# Patient Record
Sex: Male | Born: 1945 | Race: Black or African American | Hispanic: No | Marital: Married | Smoking: Former smoker
Health system: Southern US, Community
[De-identification: ages and names within clinical notes are randomized; demographics above are authoritative.]

## PROBLEM LIST (undated history)

## (undated) DIAGNOSIS — I700 Atherosclerosis of aorta: Secondary | ICD-10-CM

## (undated) DIAGNOSIS — E119 Type 2 diabetes mellitus without complications: Secondary | ICD-10-CM

## (undated) DIAGNOSIS — D126 Benign neoplasm of colon, unspecified: Secondary | ICD-10-CM

## (undated) DIAGNOSIS — Z8679 Personal history of other diseases of the circulatory system: Secondary | ICD-10-CM

## (undated) DIAGNOSIS — J449 Chronic obstructive pulmonary disease, unspecified: Secondary | ICD-10-CM

## (undated) DIAGNOSIS — B009 Herpesviral infection, unspecified: Secondary | ICD-10-CM

## (undated) DIAGNOSIS — I10 Essential (primary) hypertension: Secondary | ICD-10-CM

---

## 2006-01-21 ENCOUNTER — Ambulatory Visit: Payer: Self-pay | Admitting: Gastroenterology

## 2009-04-09 ENCOUNTER — Ambulatory Visit: Payer: Self-pay | Admitting: Family Medicine

## 2009-05-09 ENCOUNTER — Ambulatory Visit: Payer: Self-pay | Admitting: Gastroenterology

## 2012-07-08 ENCOUNTER — Ambulatory Visit: Payer: Self-pay | Admitting: Gastroenterology

## 2012-07-09 LAB — PATHOLOGY REPORT

## 2014-09-18 ENCOUNTER — Emergency Department: Payer: Medicare Other

## 2014-09-18 ENCOUNTER — Encounter: Payer: Self-pay | Admitting: Emergency Medicine

## 2014-09-18 ENCOUNTER — Emergency Department
Admission: EM | Admit: 2014-09-18 | Discharge: 2014-09-18 | Disposition: A | Discharge status: Home / Self Care | Payer: Medicare Other | Attending: Emergency Medicine | Admitting: Emergency Medicine

## 2014-09-18 DIAGNOSIS — S50812A Abrasion of left forearm, initial encounter: Secondary | ICD-10-CM | POA: Insufficient documentation

## 2014-09-18 DIAGNOSIS — Z23 Encounter for immunization: Secondary | ICD-10-CM | POA: Diagnosis not present

## 2014-09-18 DIAGNOSIS — S9302XA Subluxation of left ankle joint, initial encounter: Secondary | ICD-10-CM | POA: Diagnosis not present

## 2014-09-18 DIAGNOSIS — S92102A Unspecified fracture of left talus, initial encounter for closed fracture: Secondary | ICD-10-CM | POA: Diagnosis not present

## 2014-09-18 DIAGNOSIS — Z87891 Personal history of nicotine dependence: Secondary | ICD-10-CM | POA: Diagnosis not present

## 2014-09-18 DIAGNOSIS — Y998 Other external cause status: Secondary | ICD-10-CM | POA: Insufficient documentation

## 2014-09-18 DIAGNOSIS — W11XXXA Fall on and from ladder, initial encounter: Secondary | ICD-10-CM | POA: Insufficient documentation

## 2014-09-18 DIAGNOSIS — T148XXA Other injury of unspecified body region, initial encounter: Secondary | ICD-10-CM

## 2014-09-18 DIAGNOSIS — Y9389 Activity, other specified: Secondary | ICD-10-CM | POA: Diagnosis not present

## 2014-09-18 DIAGNOSIS — I10 Essential (primary) hypertension: Secondary | ICD-10-CM | POA: Diagnosis not present

## 2014-09-18 DIAGNOSIS — R61 Generalized hyperhidrosis: Secondary | ICD-10-CM | POA: Insufficient documentation

## 2014-09-18 DIAGNOSIS — E119 Type 2 diabetes mellitus without complications: Secondary | ICD-10-CM | POA: Diagnosis not present

## 2014-09-18 DIAGNOSIS — S99912A Unspecified injury of left ankle, initial encounter: Secondary | ICD-10-CM | POA: Diagnosis present

## 2014-09-18 DIAGNOSIS — W19XXXA Unspecified fall, initial encounter: Secondary | ICD-10-CM

## 2014-09-18 DIAGNOSIS — Y9289 Other specified places as the place of occurrence of the external cause: Secondary | ICD-10-CM | POA: Diagnosis not present

## 2014-09-18 DIAGNOSIS — S93315A Dislocation of tarsal joint of left foot, initial encounter: Secondary | ICD-10-CM

## 2014-09-18 HISTORY — DX: Type 2 diabetes mellitus without complications: E11.9

## 2014-09-18 HISTORY — DX: Essential (primary) hypertension: I10

## 2014-09-18 LAB — BASIC METABOLIC PANEL
Anion gap: 11 (ref 5–15)
BUN: 15 mg/dL (ref 6–20)
CO2: 26 mmol/L (ref 22–32)
Calcium: 8.7 mg/dL — ABNORMAL LOW (ref 8.9–10.3)
Chloride: 96 mmol/L — ABNORMAL LOW (ref 101–111)
Creatinine, Ser: 0.99 mg/dL (ref 0.61–1.24)
GFR calc Af Amer: >60 mL/min (ref >60)
GFR calc non Af Amer: >60 mL/min (ref >60)
Glucose, Bld: 151 mg/dL — ABNORMAL HIGH (ref 65–99)
Potassium: 3.4 mmol/L — ABNORMAL LOW (ref 3.5–5.1)
SODIUM: 133 mmol/L — AB (ref 135–145)

## 2014-09-18 LAB — CBC
HCT: 41 % (ref 40.0–52.0)
HEMOGLOBIN: 13.6 g/dL (ref 13.0–18.0)
MCH: 28.6 pg (ref 26.0–34.0)
MCHC: 33.3 g/dL (ref 32.0–36.0)
MCV: 85.9 fL (ref 80.0–100.0)
PLATELETS: 226 10*3/uL (ref 150–440)
RBC: 4.77 MIL/uL (ref 4.40–5.90)
RDW: 14.4 % (ref 11.5–14.5)
WBC: 10.6 10*3/uL (ref 3.8–10.6)

## 2014-09-18 LAB — PROTIME-INR
INR: 0.95
PROTHROMBIN TIME: 12.9 s (ref 11.4–15.0)

## 2014-09-18 MED ORDER — FENTANYL CITRATE (PF) 100 MCG/2ML IJ SOLN
INTRAMUSCULAR | Status: AC | PRN
  Administered 2014-09-18: 50 ug via INTRAVENOUS

## 2014-09-18 MED ORDER — HYDROMORPHONE HCL 1 MG/ML IJ SOLN
0.5000 mg | Freq: Once | INTRAMUSCULAR | Status: AC
  Administered 2014-09-18: 0.5 mg via INTRAVENOUS

## 2014-09-18 MED ORDER — OXYCODONE-ACETAMINOPHEN 5-325 MG PO TABS: 1.0000 | ORAL_TABLET | Freq: Four times a day (QID) | ORAL | Status: DC | PRN

## 2014-09-18 MED ORDER — ONDANSETRON 4 MG PO TBDP: 4.0000 mg | ORAL_TABLET | Freq: Four times a day (QID) | ORAL | Status: DC | PRN

## 2014-09-18 MED ORDER — FENTANYL CITRATE (PF) 100 MCG/2ML IJ SOLN
INTRAMUSCULAR | Status: AC
  Filled 2014-09-18: qty 2

## 2014-09-18 MED ORDER — MIDAZOLAM HCL 2 MG/2ML IJ SOLN
INTRAMUSCULAR | Status: AC | PRN
  Administered 2014-09-18 (×2): 2 mg via INTRAVENOUS

## 2014-09-18 MED ORDER — ONDANSETRON HCL 4 MG/2ML IJ SOLN
INTRAMUSCULAR | Status: AC | PRN
  Administered 2014-09-18: 4 mg via INTRAVENOUS

## 2014-09-18 MED ORDER — ONDANSETRON HCL 4 MG/2ML IJ SOLN
INTRAMUSCULAR | Status: AC
  Filled 2014-09-18: qty 2

## 2014-09-18 MED ORDER — TETANUS-DIPHTH-ACELL PERTUSSIS 5-2.5-18.5 LF-MCG/0.5 IM SUSP
INTRAMUSCULAR | Status: AC
  Filled 2014-09-18: qty 0.5

## 2014-09-18 MED ORDER — HYDROMORPHONE HCL 1 MG/ML IJ SOLN: 1.0000 mg | INTRAMUSCULAR | Status: DC

## 2014-09-18 MED ORDER — ONDANSETRON HCL 4 MG/2ML IJ SOLN
4.0000 mg | Freq: Once | INTRAMUSCULAR | Status: AC
  Administered 2014-09-18: 4 mg via INTRAVENOUS

## 2014-09-18 MED ORDER — NALOXONE HCL 1 MG/ML IJ SOLN
INTRAMUSCULAR | Status: DC
Start: 2014-09-18 — End: 2014-09-18
  Filled 2014-09-18: qty 2

## 2014-09-18 MED ORDER — MIDAZOLAM HCL 5 MG/5ML IJ SOLN
INTRAMUSCULAR | Status: AC
  Filled 2014-09-18: qty 5

## 2014-09-18 MED ORDER — HYDROMORPHONE HCL 1 MG/ML IJ SOLN
INTRAMUSCULAR | Status: AC
  Filled 2014-09-18: qty 1

## 2014-09-18 MED ORDER — TETANUS-DIPHTH-ACELL PERTUSSIS 5-2.5-18.5 LF-MCG/0.5 IM SUSP
0.5000 mL | Freq: Once | INTRAMUSCULAR | Status: AC
  Administered 2014-09-18: 0.5 mL via INTRAMUSCULAR

## 2014-09-18 NOTE — Discharge Instructions (Signed)
Ankle Dislocation  Use crutches and do not place any weight on the left foot.  Call Dr. Ammie Ferrier office for follow-up on Monday or Tuesday. Do not drive today. Do not drive while taking Percocet. Please return to the ER right away should he develop a cold or numb foot, weakness in the toes, blue toes, severe pain, or if other concerns arise.  Use Vicodin only as prescribed. Do not drive while taking this medication.  Ankle dislocation is the displacement of the bones that form your ankle joint. The ankle joint is designed for a balance of stability and flexibility. The bones of the ankle are held in place by very strong, fibrous tissues (ligaments) that connect the bones to each other. CAUSES Because the ankle is a very strong and stable joint, ankle dislocation is only caused by a very forceful injury. Typically, injuries that contribute to ankle dislocation include broken bones (fractures) on the inside and outside of the ankle (malleoli).  RELATED COMPLICATIONS Ankle dislocation can lead to more serious complications. Examples of complications associated with ankle dislocation include:  Injury to the strong fibrous tissues that connect muscles to bones (tendons).  Injury to the flexible tissue that cushions the bones in the joint (cartilage). This can lead to the development of arthritis, loss of joint motion, and pain.  Injury to the nerves and blood vessels that cross the ankle. Blood vessel damage may result in bone death of the top bone of the foot (talus).  Skin over the dislocated area being torn (lacerated) or damaged by pressure from the dislocated bones.  Swelling of compartments in the foot (rare). This may damage blood supply to the muscles (compartment syndrome). RISK FACTORS Although dislocation of the ankle can occur in anyone, some people are at greater risk than others. People at increased risk of ankle dislocation include:  Young males. This may be related to their  overall increased risk of injury.  Postmenopausal women. This may be related to their increased risk of bone fracture because of the weakening of the bones that occurs in women in this age group (osteoporosis).  People born with greater looseness (elasticity) in their ligaments. SYMPTOMS Symptoms of ankle dislocation include:  Severe pain.  Swelling.  Deformity around the ankle.  Whitening or laceration of the skin. DIAGNOSIS  A physical exam and an X-ray exam are usually done to help your caregiver diagnose ankle dislocation. TREATMENT Treatment may include:  Manipulation of the ankle by your caregiver to put your ankle back in place (reduction).  Repair of any associated skin lacerations.  Plates and screws used to stabilize the fractures and hold the joint in position after reduction.  Pins drilled into your bones that are connected to bars outside of your skin (external fixator) used to hold your ankle in a fixed position until the swelling in your ankle goes down enough for surgery to be done.  Placement of a cast or splint to allow torn ligaments to heal.  Physical therapy to regain ankle motion and leg strength. HOME CARE INSTRUCTIONS The following measures can help to reduce pain and hasten the healing process:  Rest your injured joint. Do not move it. Avoid activities similar to the one that caused your injury.  Apply ice to your injured joint for 1 to 2 days after your reduction or as directed by your caregiver. Applying ice helps to reduce inflammation and pain.  Put ice in a plastic bag.  Place a towel between your skin and the  bag.  Leave the ice on for 15 to 20 minutes at a time, every couple of hours while you are awake.  Elevate your ankle above your heart to minimize swelling.  Move your toes as instructed by your caregiver to prevent stiffness.  Take over-the-counter or prescription medicines for pain as directed by your caregiver. SEEK IMMEDIATE  MEDICAL CARE IF:  Your cast, splint, screws, plates, or external fixator becomes loose or damaged.  You have an external fixator and you notice fluids draining around the pins.  Your pain becomes worse rather than better.  You lose feeling in your toe or cannot bend the tip of your toe. MAKE SURE YOU:  Understand these instructions.  Will watch your condition.  Will get help right away if you are not doing well or get worse. Document Released: 04/23/2005 Document Revised: 07/16/2011 Document Reviewed: 09/21/2010 [facility] Patient Information 2015 [place], [place]. This information is not intended to replace advice given to you by your health care provider. Make sure you discuss any questions you have with your health care provider.

## 2014-09-18 NOTE — ED Provider Notes (Addendum)
----------------------------------------- 3:32 PM on 09/18/2014 -----------------------------------------  [facility] Emergency Department Provider Note  ____________________________________________  Time seen: Approximately 3:33 PM  I have reviewed the triage vital signs and the nursing notes.   HISTORY  Chief Complaint Fall    HPI Eddie Sandoval is a 69 y.o. male who fell off a ladder approximately 45 minutes ago. Patient states that he was standing on top of the ladder when it slipped out from under him. He fell and landed on his leg. In particular he fell on his left side landing on his left little ankle and scraping his left forearm. Of pertinence the patient denies loss of consciousness. There was no head injury. There were no preceding symptoms. The patient simply fell off the ladder after losing balance. He denies any neck or back pain. He denies any chest or abdominal pain. He denies any difficulty breathing. Pain the left ankles [place] sharp. Severity severe. Quality is sharp. Timing sudden onset. Modifying factors follow-up a ladder.  Denies numbness or tingling. He does state he does have severe pain in the left ankle. Otherwise feels well.  Patient  does note significant pain in his left ankle at this time. He also notes that is "not straight". He has not had a tetanus shot recently. Patient takes only medication for blood pressure. Patient denies drug allergies.  Patient does also report that he has significant history of smoking and when he is seen Dr. Caryl Comes in the past he has been told on multiple occasions that his oxygen level is slightly low. Again he denies any shortness of breath or respiratory symptoms.  Past Medical History  Diagnosis Date  . Hypertension   . Diabetes mellitus without complication     There are no active problems to display for this patient.   History reviewed. No pertinent past surgical history.  Current  Outpatient Rx  Name  Route  Sig  Dispense  Refill  . oxyCODONE-acetaminophen (ROXICET) 5-325 MG per tablet   Oral   Take 1 tablet by mouth every 6 (six) hours as needed for severe pain.   20 tablet   0     Allergies Azithromycin  History reviewed. No pertinent family history.  Social History History  Substance Use Topics  . Smoking status: Former Research scientist (life sciences)  . Smokeless tobacco: Not on file  . Alcohol Use: No    Review of Systems Constitutional: No fever/chills Eyes: No visual changes. ENT: No sore throat. Cardiovascular: Denies chest pain. Respiratory: Denies shortness of breath. Gastrointestinal: No abdominal pain.  No nausea, no vomiting.  No diarrhea.  No constipation. Genitourinary: Negative for dysuria. Musculoskeletal: Negative for back pain. Skin: Negative for rash. Neurological: Negative for headaches, focal weakness or numbness.  10-point ROS otherwise negative.  ____________________________________________   PHYSICAL EXAM:  VITAL SIGNS: ED Triage Vitals  Enc Vitals Group     BP 09/18/14 1526 141/91 mmHg     Pulse Rate 09/18/14 1526 90     Resp 09/18/14 1526 22     Temp 09/18/14 1526 98.1 F (36.7 C)     Temp Source 09/18/14 1526 Oral     SpO2 09/18/14 1526 92 %     Weight 09/18/14 1526 208 lb (94.348 kg)     Height 09/18/14 1526 5\' 9"  (1.753 m)     Head Cir --      Peak Flow --      Pain Score 09/18/14 1527 4     Pain Loc --  Pain Edu? --      Excl. in [place]? --     Constitutional: Alert and oriented. Well appearing and in no acute distress. Eyes: Conjunctivae are normal. PERRL. EOMI. Head: Atraumatic. Nose: No congestion/rhinnorhea. Mouth/Throat: Mucous membranes are moist.  Oropharynx non-erythematous. Neck: No stridor.  No cervical spine tenderness to palpation. No thoracic or lumbar spinal tenderness. No evidence of trauma to the posterior back or chest. Cardiovascular: Normal rate, regular rhythm. Grossly normal heart sounds.  Good  peripheral circulation. Respiratory: Normal respiratory effort.  No retractions. Lungs CTAB. Gastrointestinal: Soft and nontender. No distention. No abdominal bruits. No CVA tenderness. Musculoskeletal: No upper extremity extremity tenderness nor edema.  No joint effusions, sub-left ankle. Right lower extremity atraumatic. Full range of motion. Normal pulses. No hip pain. Left lower extremity no hip pain, no tenderness along the bony prominences except around the ankle, intact neurosensory and circulatory exam in the lower extremity and foot. He has palpable dorsalis pedis pulses on the left, there is normal capillary refill. There is moderate to severe deformity at the left ankle distal. There is no open fracture. There is significant edema over the lateral ankle joint. There is obvious deformity. Pain limits range of motion of the left lower extremity. Neurologic:  Normal speech and language. No gross focal neurologic deficits are appreciated. Speech is normal. No gait instability. Skin:  Diaphoresis on the scalp, patient states because it was hot outside today, otherwise normal skin separate small superficial abrasion over the left forearm, dry and intact. No rash noted. Psychiatric: Mood and affect are normal. Speech and behavior are normal.  ____________________________________________   LABS (all labs ordered are listed, but only abnormal results are displayed)  Labs Reviewed  BASIC METABOLIC PANEL - Abnormal; Notable for the following:    Sodium 133 (*)    Potassium 3.4 (*)    Chloride 96 (*)    Glucose, Bld 151 (*)    Calcium 8.7 (*)    All other components within normal limits  CBC  PROTIME-INR   ____________________________________________  EKG   ____________________________________________  RADIOLOGY    ----------------------------------------- 3:42 PM on 09/18/2014 -----------------------------------------  I personally reviewed the patient's portable x-rays at  this time. Appears is a significant dislocation of the foot. Patient states his pain is improving at present. He has ongoing intact neurosensory exam of the lower extremity. At this point once films are available, we will discuss with orthopedics regarding further management. I anticipate need for reduction. ____________________________________________   PROCEDURES    Procedure(s) performed: reduction, sedation, see procedure note(s).   Reduction of dislocation Date/Time: 7:30 PM Performed by: Delman Kitten Authorized by: Delman Kitten Consent: Verbal consent obtained. Risks and benefits: risks, benefits and alternatives were discussed Consent given by: patient Required items: Ortho-Glass and splinting material Time out: Immediately prior to procedure a "time out" was called to verify the correct patient, procedure, equipment, support staff and site/side marked as required.  Left ankle reduction with traction. No complications. Strong dorsalis pedis pulse post reduction, alignment appears improved. Obtaining x-ray now. Successful postreduction  Patient sedated: Fentanyl and Versed  Vitals: Vital signs were monitored during sedation. Patient tolerance: Patient tolerated the procedure well with no immediate complications. Joint: Left ankle Reduction technique: Gentle traction, long-leg posterior splint applied without complication neurovascular muscular intact postprocedure.    Procedural sedation Performed by: Delman Kitten Consent: Verbal consent obtained. Risks and benefits: risks, benefits and alternatives were discussed Required items: required blood products, implants, devices, and special equipment available  Patient identity confirmed: arm band and provided demographic data Time out: Immediately prior to procedure a "time out" was called to verify the correct patient, procedure, equipment, support staff and site/side marked as required.  Sedation type: moderate (conscious)  sedation NPO time confirmed and considedered  Sedatives: Fentanyl and Versed   Physician Time at Bedside: 35 minutes  Vitals: Vital signs were monitored during sedation. Cardiac Monitor, pulse oximeter Patient tolerance: Patient tolerated the procedure well with no immediate complications. Comments: Pt with uneventful recovered. Returned to pre-procedural sedation baseline without desaturation or complication. Patient is awake and alert at this time with no distress.   Critical Care performed: No  CRITICAL CARE Performed by: Delman Kitten      ____________________________________________   INITIAL IMPRESSION / ASSESSMENT AND PLAN / ED COURSE  Pertinent labs & imaging results that were available during my care of the patient were reviewed by me and considered in my medical decision making (see chart for details).    ----------------------------------------- 3:56 PM on 09/18/2014 -----------------------------------------  Discussed with Dr. Earnestine Leys. Consultation in emergency department and review of x-rays requested._  After reviewing radiology evaluation, there is concern for potential fracture. I will order a CT as we await Dr. Ammie Ferrier arrival.  ----------------------------------------- 5:20 PM on 09/18/2014 -----------------------------------------  Dr. Sabra Heck reviewed films, recommends attempted reduction in the ER. I discussed with the patient previously and he has signed off on and agreed to moderate sedation risks and benefits.  ----------------------------------------- 7:30 PM on 09/18/2014 -----------------------------------------  Patient is awake alert now, no distress. Clear lungs. Speaking in full sentences. Left lower extremity is in a splint now with good neurosensory exam. I discussed with Dr. Earnestine Leys who recommends discharge and follow-up with his clinic on Monday or Tuesday.  Discussed plan of care with the patient who is agreeable. We  will give the patient crutches, a prescription for Percocet (which I discussed with the patient never take more than one tablet every 6 hours) and he will follow up with orthopedics. Return precautions discussed.   FINAL CLINICAL IMPRESSION(S) / ED DIAGNOSES  Final diagnoses:  Fall  Fracture  Dislocation of subtalar joint, left, initial encounter      Delman Kitten, MD 09/18/14 1931  ----------------------------------------- 11:08 PM on 09/18/2014 -----------------------------------------  Patient's wife return to the ER as the patient has become nauseated with Percocet. She states his nausea does not allow him to tolerate keeping the medicine down well. The patient did also develop nausea with fentanyl with EMS prior. I discussed with the patient's wife that this is likely to happen with any of the narcotic pain medications. I will prescribe additional Zofran which she can use for nausea. If this does not help for his pain is out of control or he is unable to tolerate by mouth, his wife states that they will call EMS to bring him back to ER. I think attempting Zofran at home is reasonable at this point, but clearly offered that we would be happy to reevaluate him especially if he is getting any worse or not getting better with the Zofran. Wife is quite agreeable.   ----------------------------------------- 4:54 PM on 09/19/2014 -----------------------------------------  Call back to check on the patient today. He and his wife state that his pain is better. They are going to call and make an appointment with Dr. Sabra Heck tomorrow. His nausea is much better and today he said he no longer needs to take any more of the strong pain medicine as it is just "  really sore". Otherwise, he says that he can feel fine and is toes are nice and warm. No problems today.     Delman Kitten, MD 09/18/14 1540  Delman Kitten, MD 09/19/14 [phone]

## 2014-09-18 NOTE — ED Notes (Signed)
Pt reports falling off a 6 foot ladder prior to arrival. Deformity noted to left ankle. Color and sensation intact to left ankle. Given Fentanyl 157mcg by EMS prior to arrival.

## 2014-09-18 NOTE — ED Notes (Signed)
Moderate sedation complete. Pt alert and oriented. Wife at bedside. Splint intact cap refill and foot color WNL.

## 2014-09-27 ENCOUNTER — Other Ambulatory Visit: Payer: Self-pay | Admitting: Internal Medicine

## 2014-09-27 DIAGNOSIS — Z87891 Personal history of nicotine dependence: Secondary | ICD-10-CM

## 2014-09-27 DIAGNOSIS — Z136 Encounter for screening for cardiovascular disorders: Secondary | ICD-10-CM

## 2014-09-27 DIAGNOSIS — Z720 Tobacco use: Secondary | ICD-10-CM

## 2014-10-01 ENCOUNTER — Ambulatory Visit: Admission: RE | Admit: 2014-10-01 | Payer: Medicare Other | Source: Ambulatory Visit

## 2014-10-01 ENCOUNTER — Ambulatory Visit
Admission: RE | Admit: 2014-10-01 | Discharge: 2014-10-01 | Disposition: A | Discharge status: Home / Self Care | Payer: Medicare Other | Source: Ambulatory Visit | Attending: Internal Medicine | Admitting: Internal Medicine

## 2014-10-01 DIAGNOSIS — Z87891 Personal history of nicotine dependence: Secondary | ICD-10-CM | POA: Insufficient documentation

## 2014-10-01 DIAGNOSIS — Z136 Encounter for screening for cardiovascular disorders: Secondary | ICD-10-CM

## 2014-10-06 ENCOUNTER — Ambulatory Visit: Payer: Medicare Other

## 2014-10-08 ENCOUNTER — Ambulatory Visit
Admission: RE | Admit: 2014-10-08 | Discharge: 2014-10-08 | Disposition: A | Discharge status: Home / Self Care | Payer: Medicare Other | Source: Ambulatory Visit | Attending: Family Medicine | Admitting: Family Medicine

## 2014-10-08 ENCOUNTER — Other Ambulatory Visit: Payer: Self-pay | Admitting: Family Medicine

## 2014-10-08 ENCOUNTER — Inpatient Hospital Stay: Payer: Medicare Other | Attending: Family Medicine | Admitting: Family Medicine

## 2014-10-08 DIAGNOSIS — I700 Atherosclerosis of aorta: Secondary | ICD-10-CM | POA: Insufficient documentation

## 2014-10-08 DIAGNOSIS — J439 Emphysema, unspecified: Secondary | ICD-10-CM | POA: Diagnosis not present

## 2014-10-08 DIAGNOSIS — Z87891 Personal history of nicotine dependence: Secondary | ICD-10-CM | POA: Insufficient documentation

## 2014-10-08 DIAGNOSIS — Z720 Tobacco use: Secondary | ICD-10-CM

## 2014-10-08 DIAGNOSIS — I2510 Atherosclerotic heart disease of native coronary artery without angina pectoris: Secondary | ICD-10-CM | POA: Insufficient documentation

## 2014-10-08 DIAGNOSIS — F17210 Nicotine dependence, cigarettes, uncomplicated: Secondary | ICD-10-CM

## 2014-10-08 DIAGNOSIS — Z122 Encounter for screening for malignant neoplasm of respiratory organs: Secondary | ICD-10-CM | POA: Diagnosis not present

## 2014-10-08 NOTE — Progress Notes (Signed)
In accordance with CMS guidelines, patient has meet eligibility criteria including age, absence of signs or symptoms of lung cancer, the specific calculation of cigarette smoking pack-years was 40 years and is a former smoker who quit approximately 1 year ago.   A shared decision-making session was conducted prior to the performance of CT scan. This includes one or more decision aids, includes benefits and harms of screening, follow-up diagnostic testing, over-diagnosis, false positive rate, and total radiation exposure.  Counseling on the importance of adherence to annual lung cancer LDCT screening, impact of co-morbidities, and ability or willingness to undergo diagnosis and treatment is imperative for compliance of the program.  Counseling on the importance of continued smoking cessation for former smokers; the importance of smoking cessation for current smokers and information about tobacco cessation interventions have been given to patient including the [facility] at [facility], 1800 quit [place], as well as [place] specific smoking cessation programs.  Written order for lung cancer screening with LDCT has been given to the patient and any and all questions have been answered to the best of my abilities.   Yearly follow up will be scheduled by Burgess Estelle, Thoracic Navigator.

## 2014-10-11 ENCOUNTER — Other Ambulatory Visit: Payer: Self-pay | Admitting: Family Medicine

## 2014-10-11 ENCOUNTER — Telehealth: Payer: Self-pay | Admitting: *Deleted

## 2014-10-11 NOTE — Telephone Encounter (Signed)
Oncology Nurse Navigator Documentation  Oncology Nurse Navigator Flowsheets 10/11/2014  Navigator Encounter Type Screening  Time Spent with Patient 15   Notified patient of LDCT lung cancer screening results of Lung Rads  2 finding with recommendation for 12 month follow up imaging. Also notified of incidental findings noted below. Encouraged to discuss with Primary care physician. IMPRESSION: 1. Lung-RADS Category 2, benign appearance or behavior. Continue annual screening with low-dose chest CT without contrast in 12 months 2. Aortic atherosclerosis as well as 3 vessel coronary artery calcification 3. Diffuse bronchial wall thickening with emphysema, as above; imaging findings suggestive of underlying COPD

## 2015-11-07 DIAGNOSIS — I700 Atherosclerosis of aorta: Secondary | ICD-10-CM | POA: Diagnosis not present

## 2015-11-07 DIAGNOSIS — I2510 Atherosclerotic heart disease of native coronary artery without angina pectoris: Secondary | ICD-10-CM | POA: Diagnosis not present

## 2015-11-07 DIAGNOSIS — J438 Other emphysema: Secondary | ICD-10-CM | POA: Diagnosis not present

## 2015-11-07 DIAGNOSIS — I10 Essential (primary) hypertension: Secondary | ICD-10-CM | POA: Diagnosis not present

## 2015-11-07 DIAGNOSIS — Z125 Encounter for screening for malignant neoplasm of prostate: Secondary | ICD-10-CM | POA: Diagnosis not present

## 2015-11-07 DIAGNOSIS — E119 Type 2 diabetes mellitus without complications: Secondary | ICD-10-CM | POA: Diagnosis not present

## 2015-11-25 ENCOUNTER — Telehealth: Payer: Self-pay | Admitting: *Deleted

## 2015-11-25 NOTE — Telephone Encounter (Signed)
Notified patient that annual lung cancer screening low dose CT scan is due. Confirmed that patient is within the age range of 55-77, and asymptomatic, (no signs or symptoms of lung cancer). Patient denies illness that would prevent curative treatment for lung cancer if found. The patient is a former smoker, quit 2015, with a 40 pack year history. The shared decision making visit was done 10/08/14 Patient is agreeable for CT scan being scheduled.

## 2015-12-07 ENCOUNTER — Other Ambulatory Visit: Payer: Self-pay | Admitting: *Deleted

## 2015-12-07 DIAGNOSIS — Z87891 Personal history of nicotine dependence: Secondary | ICD-10-CM

## 2015-12-12 DIAGNOSIS — I2510 Atherosclerotic heart disease of native coronary artery without angina pectoris: Secondary | ICD-10-CM | POA: Diagnosis not present

## 2015-12-28 ENCOUNTER — Ambulatory Visit
Admission: RE | Admit: 2015-12-28 | Discharge: 2015-12-28 | Disposition: A | Discharge status: Home / Self Care | Payer: PPO | Source: Ambulatory Visit | Attending: Oncology | Admitting: Oncology

## 2015-12-28 DIAGNOSIS — I2510 Atherosclerotic heart disease of native coronary artery without angina pectoris: Secondary | ICD-10-CM | POA: Insufficient documentation

## 2015-12-28 DIAGNOSIS — Z87891 Personal history of nicotine dependence: Secondary | ICD-10-CM | POA: Insufficient documentation

## 2015-12-28 DIAGNOSIS — J439 Emphysema, unspecified: Secondary | ICD-10-CM | POA: Diagnosis not present

## 2015-12-28 DIAGNOSIS — Z122 Encounter for screening for malignant neoplasm of respiratory organs: Secondary | ICD-10-CM | POA: Insufficient documentation

## 2015-12-28 DIAGNOSIS — I700 Atherosclerosis of aorta: Secondary | ICD-10-CM | POA: Insufficient documentation

## 2016-01-02 ENCOUNTER — Telehealth: Payer: Self-pay | Admitting: *Deleted

## 2016-01-02 NOTE — Telephone Encounter (Signed)
Notified patient of LDCT lung cancer screening results with recommendation for 12 month follow up imaging. Also notified of incidental finding noted below. Patient verbalizes understanding.   IMPRESSION: 1. Lung-RADS Category 2, benign appearance or behavior. Continue annual screening with low-dose chest CT without contrast in 12 months . 2. Coronary artery and thoracic aortic atherosclerosis. 3. Emphysema.

## 2016-02-21 DIAGNOSIS — B001 Herpesviral vesicular dermatitis: Secondary | ICD-10-CM | POA: Diagnosis not present

## 2016-08-29 DIAGNOSIS — H40033 Anatomical narrow angle, bilateral: Secondary | ICD-10-CM | POA: Diagnosis not present

## 2016-11-08 IMAGING — CR DG ANKLE PORT 2V*L*
1 series · 3 of 3 positions shown · non-contrast
Comparison: None.

CLINICAL DATA: Left ankle pain status post fall today.

EXAM:
PORTABLE LEFT ANKLE - 2 VIEW

[Series 1: ap · 0.17mm/px · 3 of 3 slices shown]
[im 1/3]
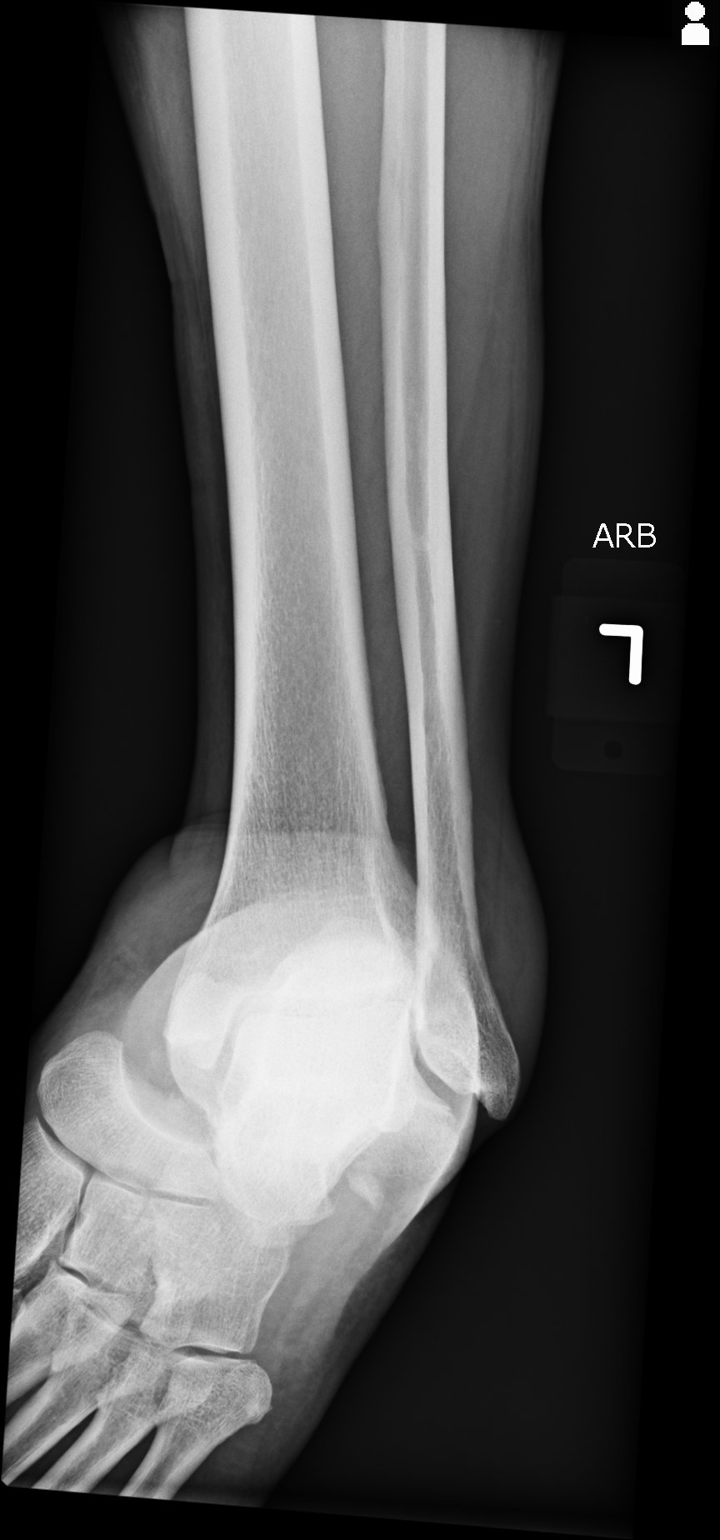
[im 2/3]
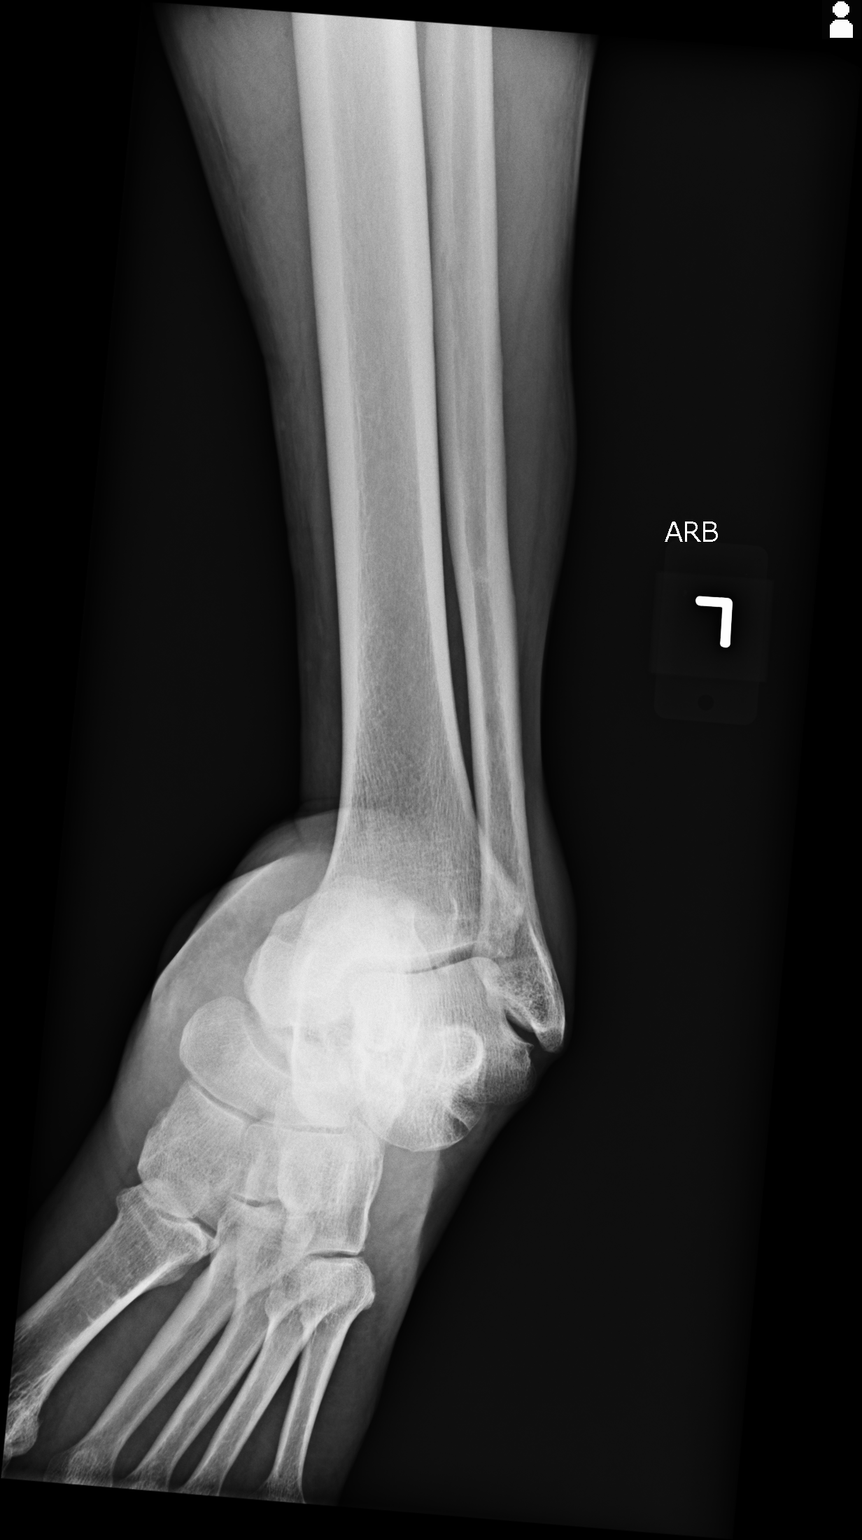
[im 3/3]
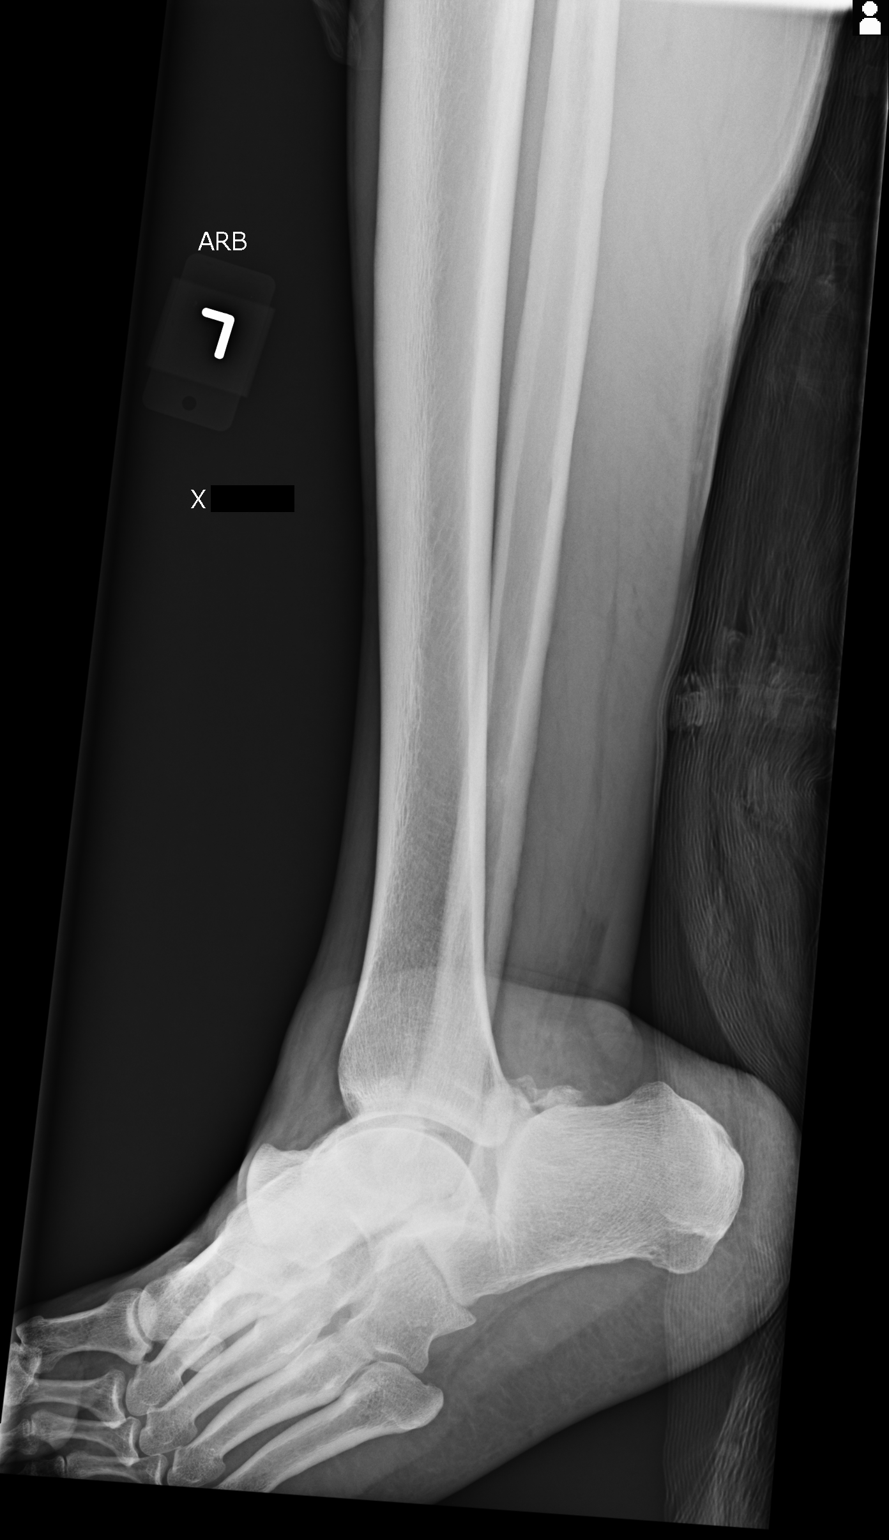

[3 of 3 positions shown; findings below may reference images not displayed]

FINDINGS: There is dislocation of the tibia and fibula and talus anteriorly
and laterally relative to the calcaneus. There is are fracture
fragments identified, donor site unclear.
IMPRESSION: Fracture and dislocation of the hindfoot. Further evaluation with a
CT scan is recommended.

## 2016-12-05 DIAGNOSIS — H40033 Anatomical narrow angle, bilateral: Secondary | ICD-10-CM | POA: Diagnosis not present

## 2016-12-19 ENCOUNTER — Telehealth: Payer: Self-pay | Admitting: *Deleted

## 2016-12-19 NOTE — Telephone Encounter (Signed)
Left message for patient to notify them that it is time to schedule annual low dose lung cancer screening CT scan. Instructed patient to call back to verify information prior to the scan being scheduled.  

## 2016-12-20 ENCOUNTER — Telehealth: Payer: Self-pay | Admitting: *Deleted

## 2016-12-20 DIAGNOSIS — Z87891 Personal history of nicotine dependence: Secondary | ICD-10-CM

## 2016-12-20 DIAGNOSIS — Z122 Encounter for screening for malignant neoplasm of respiratory organs: Secondary | ICD-10-CM

## 2016-12-20 NOTE — Telephone Encounter (Signed)
Notified patient that annual lung cancer screening low dose CT scan is due currently or will be in near future. Confirmed that patient is within the age range of 55-77, and asymptomatic, (no signs or symptoms of lung cancer). Patient denies illness that would prevent curative treatment for lung cancer if found. Verified smoking history, (former, quit 2015, 40 pack year). The shared decision making visit was done 10/08/14. Patient is agreeable for CT scan being scheduled.

## 2016-12-28 ENCOUNTER — Ambulatory Visit
Admission: RE | Admit: 2016-12-28 | Discharge: 2016-12-28 | Disposition: A | Discharge status: Home / Self Care | Payer: PPO | Source: Ambulatory Visit | Attending: Oncology | Admitting: Oncology

## 2016-12-28 DIAGNOSIS — Z122 Encounter for screening for malignant neoplasm of respiratory organs: Secondary | ICD-10-CM | POA: Insufficient documentation

## 2016-12-28 DIAGNOSIS — Z87891 Personal history of nicotine dependence: Secondary | ICD-10-CM

## 2017-01-01 DIAGNOSIS — E119 Type 2 diabetes mellitus without complications: Secondary | ICD-10-CM | POA: Diagnosis not present

## 2017-01-08 ENCOUNTER — Encounter: Payer: Self-pay | Admitting: *Deleted

## 2017-01-22 DIAGNOSIS — E119 Type 2 diabetes mellitus without complications: Secondary | ICD-10-CM | POA: Diagnosis not present

## 2017-01-22 DIAGNOSIS — K760 Fatty (change of) liver, not elsewhere classified: Secondary | ICD-10-CM | POA: Diagnosis not present

## 2017-01-22 DIAGNOSIS — K7460 Unspecified cirrhosis of liver: Secondary | ICD-10-CM | POA: Diagnosis not present

## 2017-01-22 DIAGNOSIS — I2510 Atherosclerotic heart disease of native coronary artery without angina pectoris: Secondary | ICD-10-CM | POA: Diagnosis not present

## 2017-01-22 DIAGNOSIS — I10 Essential (primary) hypertension: Secondary | ICD-10-CM | POA: Diagnosis not present

## 2017-01-22 DIAGNOSIS — Z79899 Other long term (current) drug therapy: Secondary | ICD-10-CM | POA: Diagnosis not present

## 2017-01-22 DIAGNOSIS — J438 Other emphysema: Secondary | ICD-10-CM | POA: Diagnosis not present

## 2017-01-22 DIAGNOSIS — Z0000 Encounter for general adult medical examination without abnormal findings: Secondary | ICD-10-CM | POA: Diagnosis not present

## 2017-01-22 DIAGNOSIS — I700 Atherosclerosis of aorta: Secondary | ICD-10-CM | POA: Diagnosis not present

## 2017-01-23 ENCOUNTER — Other Ambulatory Visit: Payer: Self-pay | Admitting: Internal Medicine

## 2017-01-23 DIAGNOSIS — K760 Fatty (change of) liver, not elsewhere classified: Secondary | ICD-10-CM

## 2017-01-25 ENCOUNTER — Ambulatory Visit
Admission: RE | Admit: 2017-01-25 | Discharge: 2017-01-25 | Disposition: A | Discharge status: Home / Self Care | Payer: PPO | Source: Ambulatory Visit | Attending: Internal Medicine | Admitting: Internal Medicine

## 2017-01-25 DIAGNOSIS — K760 Fatty (change of) liver, not elsewhere classified: Secondary | ICD-10-CM

## 2017-01-25 DIAGNOSIS — K7460 Unspecified cirrhosis of liver: Secondary | ICD-10-CM | POA: Insufficient documentation

## 2017-02-27 DIAGNOSIS — M9903 Segmental and somatic dysfunction of lumbar region: Secondary | ICD-10-CM | POA: Diagnosis not present

## 2017-02-27 DIAGNOSIS — M5136 Other intervertebral disc degeneration, lumbar region: Secondary | ICD-10-CM | POA: Diagnosis not present

## 2017-05-01 DIAGNOSIS — Z79899 Other long term (current) drug therapy: Secondary | ICD-10-CM | POA: Diagnosis not present

## 2017-05-01 DIAGNOSIS — K7460 Unspecified cirrhosis of liver: Secondary | ICD-10-CM | POA: Diagnosis not present

## 2017-05-01 DIAGNOSIS — K760 Fatty (change of) liver, not elsewhere classified: Secondary | ICD-10-CM | POA: Diagnosis not present

## 2017-05-01 DIAGNOSIS — E119 Type 2 diabetes mellitus without complications: Secondary | ICD-10-CM | POA: Diagnosis not present

## 2017-05-01 DIAGNOSIS — I10 Essential (primary) hypertension: Secondary | ICD-10-CM | POA: Diagnosis not present

## 2017-05-01 DIAGNOSIS — I700 Atherosclerosis of aorta: Secondary | ICD-10-CM | POA: Diagnosis not present

## 2017-05-01 DIAGNOSIS — I2510 Atherosclerotic heart disease of native coronary artery without angina pectoris: Secondary | ICD-10-CM | POA: Diagnosis not present

## 2017-12-06 ENCOUNTER — Encounter: Payer: Self-pay | Admitting: *Deleted

## 2017-12-09 ENCOUNTER — Encounter
Admission: RE | Disposition: A | Discharge status: Home / Self Care | Payer: Self-pay | Source: Ambulatory Visit | Attending: Gastroenterology

## 2017-12-09 ENCOUNTER — Ambulatory Visit: Payer: Medicare HMO | Admitting: Certified Registered Nurse Anesthetist

## 2017-12-09 ENCOUNTER — Encounter: Payer: Self-pay | Admitting: Certified Registered Nurse Anesthetist

## 2017-12-09 ENCOUNTER — Ambulatory Visit
Admission: RE | Admit: 2017-12-09 | Discharge: 2017-12-09 | Disposition: A | Discharge status: Home / Self Care | Payer: Medicare HMO | Source: Ambulatory Visit | Attending: Gastroenterology | Admitting: Gastroenterology

## 2017-12-09 DIAGNOSIS — E119 Type 2 diabetes mellitus without complications: Secondary | ICD-10-CM | POA: Diagnosis not present

## 2017-12-09 DIAGNOSIS — K635 Polyp of colon: Secondary | ICD-10-CM | POA: Insufficient documentation

## 2017-12-09 DIAGNOSIS — Z7984 Long term (current) use of oral hypoglycemic drugs: Secondary | ICD-10-CM | POA: Diagnosis not present

## 2017-12-09 DIAGNOSIS — Z79899 Other long term (current) drug therapy: Secondary | ICD-10-CM | POA: Insufficient documentation

## 2017-12-09 DIAGNOSIS — J449 Chronic obstructive pulmonary disease, unspecified: Secondary | ICD-10-CM | POA: Diagnosis not present

## 2017-12-09 DIAGNOSIS — I2510 Atherosclerotic heart disease of native coronary artery without angina pectoris: Secondary | ICD-10-CM | POA: Insufficient documentation

## 2017-12-09 DIAGNOSIS — Z87891 Personal history of nicotine dependence: Secondary | ICD-10-CM | POA: Diagnosis not present

## 2017-12-09 DIAGNOSIS — Z8679 Personal history of other diseases of the circulatory system: Secondary | ICD-10-CM | POA: Insufficient documentation

## 2017-12-09 DIAGNOSIS — K621 Rectal polyp: Secondary | ICD-10-CM | POA: Insufficient documentation

## 2017-12-09 DIAGNOSIS — Z1211 Encounter for screening for malignant neoplasm of colon: Secondary | ICD-10-CM | POA: Diagnosis present

## 2017-12-09 DIAGNOSIS — I739 Peripheral vascular disease, unspecified: Secondary | ICD-10-CM | POA: Insufficient documentation

## 2017-12-09 DIAGNOSIS — Z881 Allergy status to other antibiotic agents status: Secondary | ICD-10-CM | POA: Insufficient documentation

## 2017-12-09 DIAGNOSIS — Z86010 Personal history of colonic polyps: Secondary | ICD-10-CM | POA: Insufficient documentation

## 2017-12-09 DIAGNOSIS — Z888 Allergy status to other drugs, medicaments and biological substances status: Secondary | ICD-10-CM | POA: Diagnosis not present

## 2017-12-09 DIAGNOSIS — K5730 Diverticulosis of large intestine without perforation or abscess without bleeding: Secondary | ICD-10-CM | POA: Diagnosis not present

## 2017-12-09 DIAGNOSIS — Z7982 Long term (current) use of aspirin: Secondary | ICD-10-CM | POA: Insufficient documentation

## 2017-12-09 DIAGNOSIS — I10 Essential (primary) hypertension: Secondary | ICD-10-CM | POA: Diagnosis not present

## 2017-12-09 DIAGNOSIS — I700 Atherosclerosis of aorta: Secondary | ICD-10-CM | POA: Diagnosis not present

## 2017-12-09 HISTORY — PX: COLONOSCOPY WITH PROPOFOL: SHX5780

## 2017-12-09 HISTORY — DX: Chronic obstructive pulmonary disease, unspecified: J44.9

## 2017-12-09 HISTORY — DX: Herpesviral infection, unspecified: B00.9

## 2017-12-09 HISTORY — DX: Atherosclerosis of aorta: I70.0

## 2017-12-09 HISTORY — DX: Benign neoplasm of colon, unspecified: D12.6

## 2017-12-09 HISTORY — DX: Personal history of other diseases of the circulatory system: Z86.79

## 2017-12-09 LAB — GLUCOSE, CAPILLARY: Glucose-Capillary: 101 mg/dL — ABNORMAL HIGH (ref 70–99)

## 2017-12-09 SURGERY — COLONOSCOPY WITH PROPOFOL
Anesthesia: General

## 2017-12-09 MED ORDER — PROPOFOL 500 MG/50ML IV EMUL
INTRAVENOUS | Status: DC | PRN
  Administered 2017-12-09: 130 ug/kg/min via INTRAVENOUS

## 2017-12-09 MED ORDER — LIDOCAINE HCL (CARDIAC) PF 100 MG/5ML IV SOSY
PREFILLED_SYRINGE | INTRAVENOUS | Status: DC | PRN
  Administered 2017-12-09: 50 mg via INTRAVENOUS

## 2017-12-09 MED ORDER — PROPOFOL 10 MG/ML IV BOLUS
INTRAVENOUS | Status: DC | PRN
  Administered 2017-12-09: 100 mg via INTRAVENOUS

## 2017-12-09 MED ORDER — SODIUM CHLORIDE 0.9 % IV SOLN: INTRAVENOUS | Status: DC

## 2017-12-09 MED ORDER — SODIUM CHLORIDE 0.9 % IV SOLN
INTRAVENOUS | Status: DC
  Administered 2017-12-09: 1000 mL via INTRAVENOUS

## 2017-12-09 MED ORDER — PROPOFOL 500 MG/50ML IV EMUL
INTRAVENOUS | Status: AC
  Filled 2017-12-09: qty 50

## 2017-12-09 MED ORDER — LIDOCAINE HCL (PF) 2 % IJ SOLN
INTRAMUSCULAR | Status: AC
  Filled 2017-12-09: qty 10

## 2017-12-09 MED ORDER — PROPOFOL 10 MG/ML IV BOLUS
INTRAVENOUS | Status: AC
  Filled 2017-12-09: qty 20

## 2017-12-09 NOTE — Anesthesia Preprocedure Evaluation (Signed)
Anesthesia Evaluation  Patient identified by MRN, date of birth, ID band Patient awake    Reviewed: Allergy & Precautions, H&P , NPO status , Patient's Chart, lab work & pertinent test results, reviewed documented beta blocker date and time   Airway Mallampati: II   Neck ROM: full    Dental  (+) Poor Dentition   Pulmonary neg pulmonary ROS, COPD, former smoker,    Pulmonary exam normal        Cardiovascular Exercise Tolerance: Good hypertension, On Medications + CAD and + Peripheral Vascular Disease  negative cardio ROS Normal cardiovascular exam Rhythm:regular Rate:Normal     Neuro/Psych negative neurological ROS  negative psych ROS   GI/Hepatic negative GI ROS, Neg liver ROS,   Endo/Other  negative endocrine ROSdiabetes, Well Controlled, Type 1  Renal/GU negative Renal ROS  negative genitourinary   Musculoskeletal   Abdominal   Peds  Hematology negative hematology ROS (+)   Anesthesia Other Findings Past Medical History: No date: Adenomatous colon polyp No date: Aortic atherosclerosis (HCC) No date: COPD (chronic obstructive pulmonary disease) (HCC) No date: Diabetes mellitus without complication (HCC) No date: H/O: rheumatic fever No date: Herpes No date: Hypertension History reviewed. No pertinent surgical history. BMI    Body Mass Index:  29.53 kg/m     Reproductive/Obstetrics negative OB ROS                             Anesthesia Physical Anesthesia Plan  ASA: III  Anesthesia Plan: General   Post-op Pain Management:    Induction:   PONV Risk Score and Plan:   Airway Management Planned:   Additional Equipment:   Intra-op Plan:   Post-operative Plan:   Informed Consent: I have reviewed the patients History and Physical, chart, labs and discussed the procedure including the risks, benefits and alternatives for the proposed anesthesia with the patient or  authorized representative who has indicated his/her understanding and acceptance.   Dental Advisory Given  Plan Discussed with: CRNA  Anesthesia Plan Comments:         Anesthesia Quick Evaluation

## 2017-12-09 NOTE — Transfer of Care (Signed)
Immediate Anesthesia Transfer of Care Note  Patient: LOUIS IVERY  Procedure(s) Performed: COLONOSCOPY WITH PROPOFOL (N/A )  Patient Location: PACU and Endoscopy Unit  Anesthesia Type:General  Level of Consciousness: drowsy  Airway & Oxygen Therapy: Patient Spontanous Breathing  Post-op Assessment: Report given to RN and Post -op Vital signs reviewed and stable  Post vital signs: Reviewed and stable  Last Vitals:  Vitals Value Taken Time  BP 82/42 12/09/2017  8:27 AM  Temp 36.3 C 12/09/2017  8:26 AM  Pulse 60 12/09/2017  8:27 AM  Resp 14 12/09/2017  8:27 AM  SpO2 97 % 12/09/2017  8:27 AM  Vitals shown include unvalidated device data.  Last Pain:  Vitals:   12/09/17 0826  TempSrc: Tympanic  PainSc: 0-No pain         Complications: No apparent anesthesia complications

## 2017-12-09 NOTE — H&P (Signed)
Outpatient short stay form Pre-procedure 12/09/2017 7:19 AM Lollie Sails MD  Primary Physician: Dr. Ramonita Lab  Reason for visit: Colonoscopy  History of present illness: Patient is a 72 year old male presenting today as above.  He tolerated his prep well.  He does take a daily 81 mg aspirin but no other aspirin products or blood thinning agents.    Current Facility-Administered Medications:  .  0.9 %  sodium chloride infusion, , Intravenous, Continuous, Lollie Sails, MD .  0.9 %  sodium chloride infusion, , Intravenous, Continuous, Lollie Sails, MD, Last Rate: 20 mL/hr at 12/09/17 0713, 1,000 mL at 12/09/17 1610  Medications Prior to Admission  Medication Sig Dispense Refill Last Dose  . acetaminophen (TYLENOL) 500 MG tablet Take 500 mg by mouth every 6 (six) hours as needed.   Past Week at Unknown time  . aspirin EC 81 MG tablet Take 81 mg by mouth daily.   Past Week at Unknown time  . atorvastatin (LIPITOR) 10 MG tablet Take 10 mg by mouth daily.   Past Week at Unknown time  . lisinopril-hydrochlorothiazide (PRINZIDE,ZESTORETIC) 20-25 MG per tablet Take 1 tablet by mouth every morning.   12/09/2017 at 0630  . metFORMIN (GLUCOPHAGE) 500 MG tablet Take 1 tablet by mouth 2 (two) times daily as needed. For diabetes   Past Week at Unknown time  . ondansetron (ZOFRAN ODT) 4 MG disintegrating tablet Take 1 tablet (4 mg total) by mouth every 6 (six) hours as needed for nausea or vomiting. 20 tablet 0 Past Week at Unknown time  . oxyCODONE-acetaminophen (ROXICET) 5-325 MG per tablet Take 1 tablet by mouth every 6 (six) hours as needed for severe pain. 20 tablet 0 Past Week at Unknown time  . polyethylene glycol-electrolytes (NULYTELY/GOLYTELY) 420 g solution Take 4,000 mLs by mouth once.   12/08/2017 at Unknown time  . traMADol (ULTRAM) 50 MG tablet Take 50 mg by mouth 2 (two) times daily.   Past Week at Unknown time     Allergies  Allergen Reactions  . Azithromycin Other (See  Comments)    Tongue pain Mouth sores   . Tadalafil Other (See Comments)    Facial flushing     Past Medical History:  Diagnosis Date  . Adenomatous colon polyp   . Aortic atherosclerosis ([place])   . COPD (chronic obstructive pulmonary disease) ([place])   . Diabetes mellitus without complication ([place])   . H/O: rheumatic fever   . Herpes   . Hypertension     Review of systems:      Physical Exam    Heart and lungs: Regular rate and rhythm without rub or gallop, lungs are bilaterally clear.    HEENT: Normocephalic atraumatic eyes are anicteric    Other:    Pertinant exam for procedure: Soft nontender nondistended bowel sounds positive normoactive.    Planned proceedures: Colonoscopy and indicated procedures. I have discussed the risks benefits and complications of procedures to include not limited to bleeding, infection, perforation and the risk of sedation and the patient wishes to proceed.    Lollie Sails, MD Gastroenterology 12/09/2017  7:19 AM

## 2017-12-09 NOTE — Anesthesia Postprocedure Evaluation (Signed)
Anesthesia Post Note  Patient: Eddie Sandoval  Procedure(s) Performed: COLONOSCOPY WITH PROPOFOL (N/A )  Patient location during evaluation: PACU Anesthesia Type: General Level of consciousness: awake and alert Pain management: pain level controlled Vital Signs Assessment: post-procedure vital signs reviewed and stable Respiratory status: spontaneous breathing, nonlabored ventilation, respiratory function stable and patient connected to nasal cannula oxygen Cardiovascular status: blood pressure returned to baseline and stable Postop Assessment: no apparent nausea or vomiting Anesthetic complications: no     Last Vitals:  Vitals:   12/09/17 0836 12/09/17 0850  BP: 100/69 129/79  Pulse:    Resp:    Temp:    SpO2:      Last Pain:  Vitals:   12/09/17 0850  TempSrc:   PainSc: 0-No pain                 Molli Barrows

## 2017-12-09 NOTE — Anesthesia Post-op Follow-up Note (Signed)
Anesthesia QCDR form completed.        

## 2017-12-09 NOTE — Op Note (Signed)
[facility] Gastroenterology Patient Name: Eddie Sandoval Procedure Date: 12/09/2017 7:35 AM MRN: [id number] Account #: [id number] Date of Birth: 1945-07-21 Admit Type: Outpatient Age: 72 Room: [facility] ENDO ROOM 1 Gender: Male Note Status: Finalized Procedure:            Colonoscopy Indications:          Personal history of colonic polyps Providers:            Lollie Sails, MD Referring MD:         Ramonita Lab, MD (Referring MD) Medicines:            Monitored Anesthesia Care Complications:        No immediate complications. Procedure:            Pre-Anesthesia Assessment:                       - ASA Grade Assessment: III - A patient with severe                        systemic disease.                       After obtaining informed consent, the colonoscope was                        passed under direct vision. Throughout the procedure,                        the patient's blood pressure, pulse, and oxygen                        saturations were monitored continuously. The                        Colonoscope was introduced through the anus and                        advanced to the the cecum, identified by appendiceal                        orifice and ileocecal valve. The patient tolerated the                        procedure well. The colonoscopy was performed with                        moderate difficulty due to significant looping.                        Successful completion of the procedure was aided by                        changing the patient to a supine position, changing the                        patient to a prone position and using manual pressure.                        The patient tolerated the procedure well. The quality  of the bowel preparation was good. Findings:      A 3 mm polyp was found in the descending colon. The polyp was sessile.       The polyp was removed with a cold biopsy forceps. Resection and   retrieval were complete.      A localized area of granular mucosa was found at the ileocecal valve.       Biopsies were taken with a cold forceps for histology.      A 5 mm polyp was found in the sigmoid colon. The polyp was sessile. The       polyp was removed with a cold biopsy forceps. Resection and retrieval       were complete.      Four sessile polyps were found in the recto-sigmoid colon. The polyps       were 1 to 2 mm in size. These polyps were removed with a cold biopsy       forceps. Resection and retrieval were complete.      Five sessile polyps were found in the rectum. The polyps were 1 to 3 mm       in size. These polyps were removed with a cold biopsy forceps. Resection       and retrieval were complete.      A few medium-mouthed diverticula were found in the sigmoid colon and       descending colon.      The retroflexed view of the distal rectum and anal verge was normal and       showed no anal or rectal abnormalities. Impression:           - One 3 mm polyp in the descending colon, removed with                        a cold biopsy forceps. Resected and retrieved.                       - Granularity at the ileocecal valve. Biopsied.                       - One 5 mm polyp in the sigmoid colon, removed with a                        cold biopsy forceps. Resected and retrieved.                       - Four 1 to 2 mm polyps at the recto-sigmoid colon,                        removed with a cold biopsy forceps. Resected and                        retrieved.                       - Five 1 to 3 mm polyps in the rectum, removed with a                        cold biopsy forceps. Resected and retrieved.                       - Diverticulosis in the sigmoid colon  and in the                        descending colon. Recommendation:       - Await pathology results.                       - Telephone GI clinic for pathology results. Procedure Code(s):    --- Professional ---                        [phone], Colonoscopy, flexible; with biopsy, single or                        multiple Diagnosis Code(s):    --- Professional ---                       D12.4, Benign neoplasm of descending colon                       D12.5, Benign neoplasm of sigmoid colon                       K63.89, Other specified diseases of intestine                       D12.7, Benign neoplasm of rectosigmoid junction                       K62.1, Rectal polyp                       Z86.010, Personal history of colonic polyps                       K57.30, Diverticulosis of large intestine without                        perforation or abscess without bleeding CPT copyright 2017 [employer]. All rights reserved. The codes documented in this report are preliminary and upon coder review may  be revised to meet current compliance requirements. Lollie Sails, MD 12/09/2017 8:28:29 AM This report has been signed electronically. Number of Addenda: 0 Note Initiated On: 12/09/2017 7:35 AM Scope Withdrawal Time: 0 hours 14 minutes 37 seconds  Total Procedure Duration: 0 hours 40 minutes 26 seconds       [facility]

## 2017-12-10 ENCOUNTER — Encounter: Payer: Self-pay | Admitting: Gastroenterology

## 2017-12-10 LAB — SURGICAL PATHOLOGY

## 2017-12-21 ENCOUNTER — Telehealth: Payer: Self-pay

## 2017-12-21 NOTE — Telephone Encounter (Signed)
Call pt regarding lung screening. PT is a former smoker. Pt would like scan any day in the  Morning.

## 2017-12-23 ENCOUNTER — Other Ambulatory Visit: Payer: Self-pay | Admitting: *Deleted

## 2017-12-23 DIAGNOSIS — Z122 Encounter for screening for malignant neoplasm of respiratory organs: Secondary | ICD-10-CM

## 2017-12-24 ENCOUNTER — Telehealth: Payer: Self-pay | Admitting: *Deleted

## 2017-12-24 NOTE — Telephone Encounter (Signed)
Called and left patient a message r/t his appt for LDCT screening on Monday 12/28/2017 @ 8:15am here @ [place].

## 2017-12-30 ENCOUNTER — Ambulatory Visit
Admission: RE | Admit: 2017-12-30 | Discharge: 2017-12-30 | Disposition: A | Discharge status: Home / Self Care | Payer: Medicare HMO | Source: Ambulatory Visit | Attending: Nurse Practitioner | Admitting: Nurse Practitioner

## 2017-12-30 DIAGNOSIS — I700 Atherosclerosis of aorta: Secondary | ICD-10-CM | POA: Insufficient documentation

## 2017-12-30 DIAGNOSIS — I2510 Atherosclerotic heart disease of native coronary artery without angina pectoris: Secondary | ICD-10-CM | POA: Insufficient documentation

## 2017-12-30 DIAGNOSIS — Z87891 Personal history of nicotine dependence: Secondary | ICD-10-CM | POA: Diagnosis not present

## 2017-12-30 DIAGNOSIS — Z122 Encounter for screening for malignant neoplasm of respiratory organs: Secondary | ICD-10-CM | POA: Insufficient documentation

## 2017-12-30 DIAGNOSIS — J432 Centrilobular emphysema: Secondary | ICD-10-CM | POA: Diagnosis not present

## 2018-01-07 ENCOUNTER — Encounter: Payer: Self-pay | Admitting: *Deleted

## 2019-01-08 ENCOUNTER — Telehealth: Payer: Self-pay | Admitting: *Deleted

## 2019-01-08 DIAGNOSIS — Z87891 Personal history of nicotine dependence: Secondary | ICD-10-CM

## 2019-01-08 DIAGNOSIS — Z122 Encounter for screening for malignant neoplasm of respiratory organs: Secondary | ICD-10-CM

## 2019-01-08 NOTE — Telephone Encounter (Signed)
Patient has been notified that lung cancer screening CT scan is due currently or will be in near future. Confirmed that patient is within the appropriate age range, and asymptomatic, (no signs or symptoms of lung cancer). Patient denies illness that would prevent curative treatment for lung cancer if found. Verified smoking history (Former Smoker). Patient is agreeable for CT scan being scheduled and would prefer a morning appointment.

## 2019-01-09 NOTE — Addendum Note (Signed)
Addended by: Lieutenant Diego on: 01/09/2019 02:44 PM   Modules accepted: Orders

## 2019-01-09 NOTE — Telephone Encounter (Signed)
Former smoker, quit 2015, 40 pack year

## 2019-01-15 ENCOUNTER — Other Ambulatory Visit: Payer: Self-pay

## 2019-01-15 ENCOUNTER — Ambulatory Visit
Admission: RE | Admit: 2019-01-15 | Discharge: 2019-01-15 | Disposition: A | Discharge status: Home / Self Care | Payer: Medicare HMO | Source: Ambulatory Visit | Attending: Oncology | Admitting: Oncology

## 2019-01-15 DIAGNOSIS — Z122 Encounter for screening for malignant neoplasm of respiratory organs: Secondary | ICD-10-CM | POA: Diagnosis not present

## 2019-01-15 DIAGNOSIS — Z87891 Personal history of nicotine dependence: Secondary | ICD-10-CM | POA: Insufficient documentation

## 2019-01-19 ENCOUNTER — Encounter: Payer: Self-pay | Admitting: *Deleted

## 2019-02-18 IMAGING — CT CT CHEST LUNG CANCER SCREENING LOW DOSE W/O CM
1 of 2 series · 10 of 20 positions shown, 13 images · non-contrast
Comparison: 12/28/2015.

CLINICAL DATA: Former smoker, quit 3 years ago, 40 pack-year
history, lung cancer screening.

EXAM:
CT CHEST WITHOUT CONTRAST LOW-DOSE FOR LUNG CANCER SCREENING
TECHNIQUE: Multidetector CT imaging of the chest was performed following the
standard protocol without IV contrast.

[ct lung segmentation data · axial · 0.76mm/px · z∈[-708,-708]mm · 10 of 333 frames shown]
[frame 1/333  mediastinal]
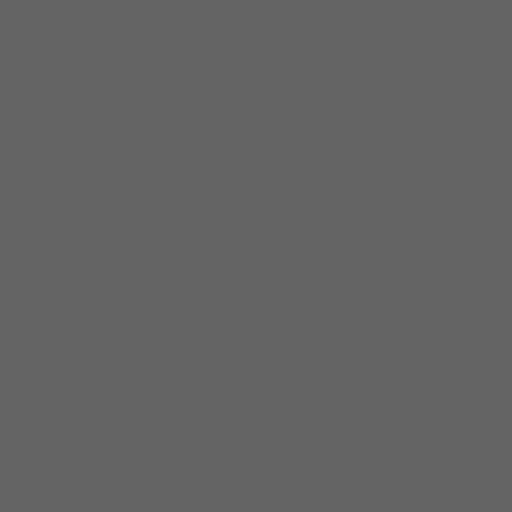
[frame 1/333  lung]
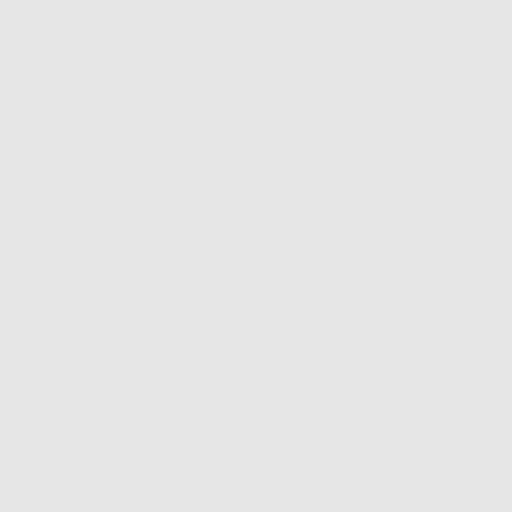
[frame 37/333  lung]
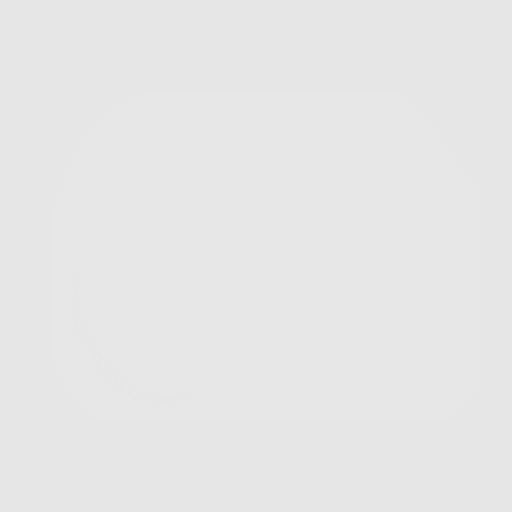
[frame 74/333  lung]
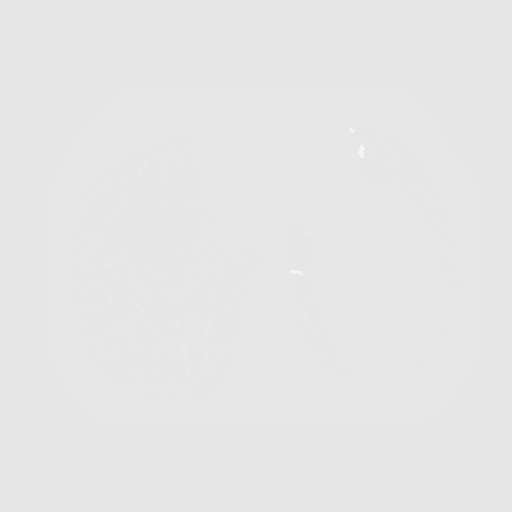
[frame 111/333  lung]
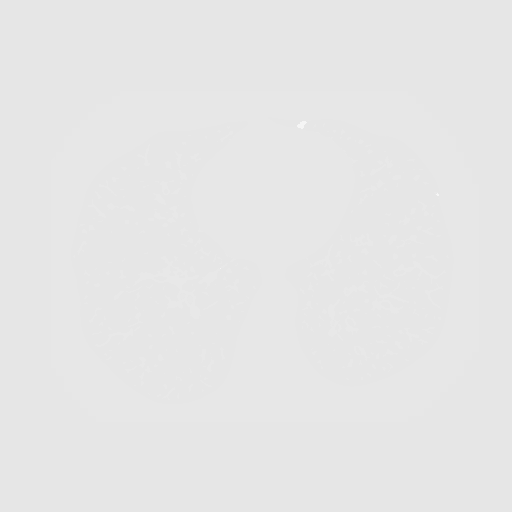
[frame 148/333  mediastinal]
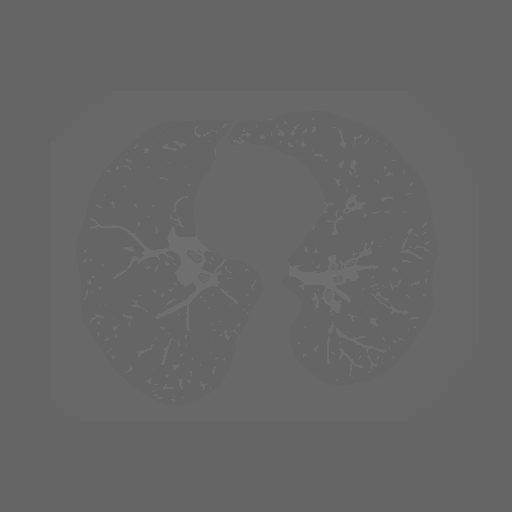
[frame 148/333  lung]
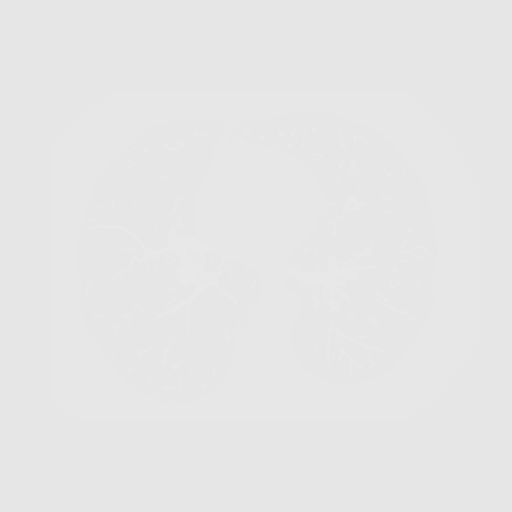
[frame 185/333  lung]
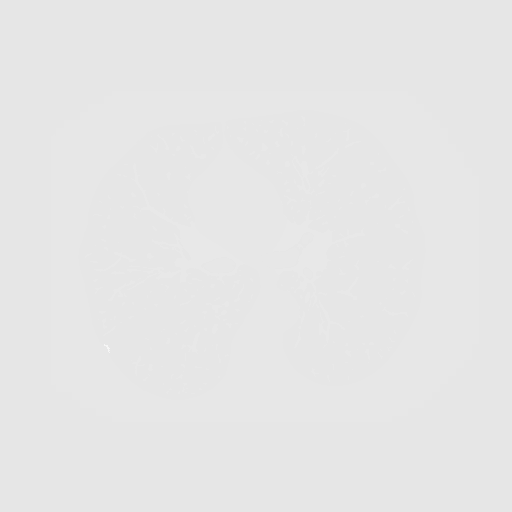
[frame 222/333  lung]
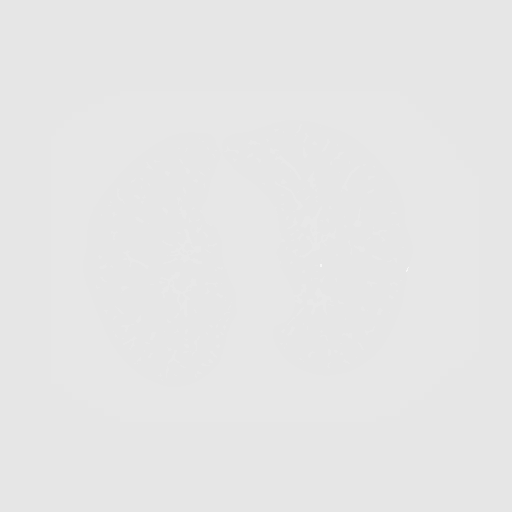
[frame 259/333  lung]
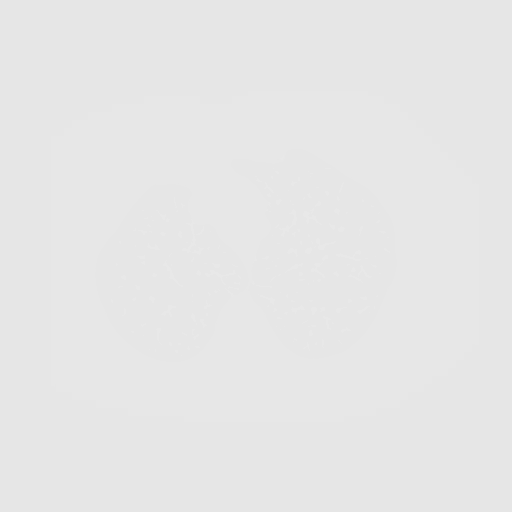
[frame 296/333  mediastinal]
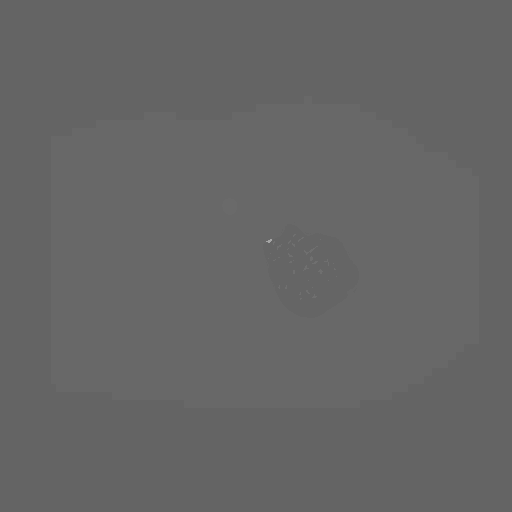
[frame 296/333  lung]
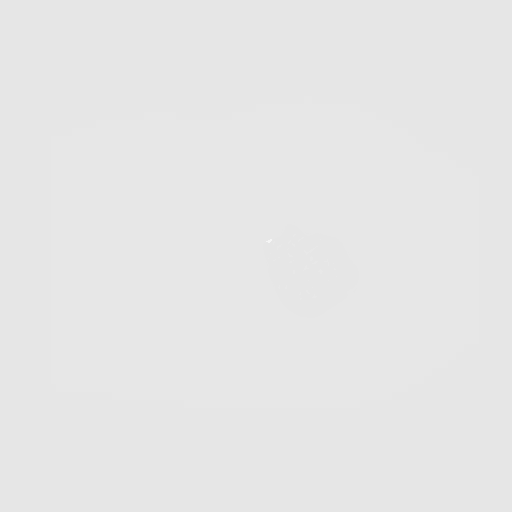
[frame 333/333  lung]
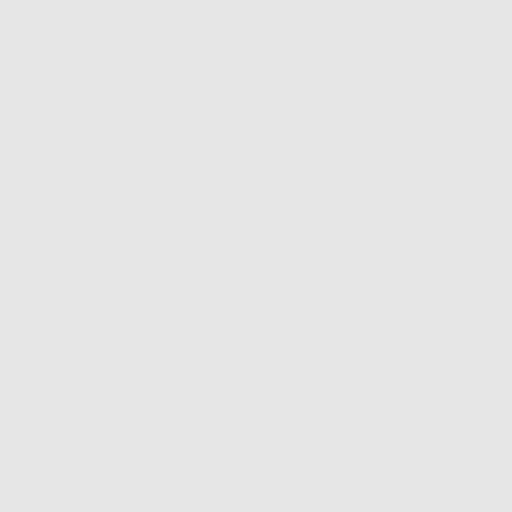

[10 of 20 positions shown; findings below may reference images not displayed]

FINDINGS: Cardiovascular: Atherosclerotic calcification of the arterial
vasculature, including three-vessel involvement of the coronary
arteries. Aortic valve is calcified. Heart size normal. No
pericardial effusion.

Mediastinum/Nodes: No pathologically enlarged mediastinal or
axillary lymph nodes. Hilar regions are difficult to evaluate
without IV contrast. Esophagus is grossly unremarkable.

Lungs/Pleura: Moderate centrilobular emphysema. A few scattered
pulmonary nodules are predominantly subpleural in location and
measure 3.6 mm or less in size, as before. No new pulmonary nodules.
No pleural fluid. Airway is unremarkable.

Upper Abdomen: Visualized portion of the liver is decreased in
attenuation diffusely. Liver margin is slightly irregular. Right
adrenal gland is unremarkable. Left adrenal thickening. Visualized
portions of the kidneys, spleen, pancreas, stomach and bowel are
grossly unremarkable. No upper abdominal adenopathy.

Musculoskeletal: Mild degenerative changes in the spine. No
worrisome lytic or sclerotic lesions.
IMPRESSION: 1. Lung-RADS 2, benign appearance or behavior. Continue annual
screening with low-dose chest CT without contrast in 12 months.
2. Aortic atherosclerosis (HUBK3-170.0). Three-vessel coronary
artery calcification.
3.  Emphysema (HUBK3-HQ6.1).
4. Hepatic steatosis. Marginal irregularity is indicative of
cirrhosis.

## 2019-08-13 ENCOUNTER — Other Ambulatory Visit: Payer: Self-pay | Admitting: Internal Medicine

## 2019-08-13 DIAGNOSIS — K7460 Unspecified cirrhosis of liver: Secondary | ICD-10-CM

## 2019-08-19 ENCOUNTER — Other Ambulatory Visit: Payer: Self-pay

## 2019-08-19 ENCOUNTER — Ambulatory Visit
Admission: RE | Admit: 2019-08-19 | Discharge: 2019-08-19 | Disposition: A | Discharge status: Home / Self Care | Payer: Medicare HMO | Source: Ambulatory Visit | Attending: Internal Medicine | Admitting: Internal Medicine

## 2019-08-19 DIAGNOSIS — K7460 Unspecified cirrhosis of liver: Secondary | ICD-10-CM | POA: Insufficient documentation

## 2019-12-16 ENCOUNTER — Telehealth: Payer: Self-pay | Admitting: *Deleted

## 2019-12-16 DIAGNOSIS — Z87891 Personal history of nicotine dependence: Secondary | ICD-10-CM

## 2019-12-16 DIAGNOSIS — Z122 Encounter for screening for malignant neoplasm of respiratory organs: Secondary | ICD-10-CM

## 2019-12-16 NOTE — Telephone Encounter (Signed)
(  12/16/2019) Left message for pt to notify them that it is time to schedule annual low dose lung cancer screening CT scan. Instructed patient to call back to verify information prior to the scan being scheduled SRW

## 2019-12-17 NOTE — Addendum Note (Signed)
Addended by: Lieutenant Diego on: 12/17/2019 03:42 PM   Modules accepted: Orders

## 2019-12-17 NOTE — Telephone Encounter (Signed)
Contacted and scheduled 

## 2020-01-18 ENCOUNTER — Ambulatory Visit
Admission: RE | Admit: 2020-01-18 | Discharge: 2020-01-18 | Disposition: A | Discharge status: Home / Self Care | Payer: Medicare HMO | Source: Ambulatory Visit | Attending: Nurse Practitioner | Admitting: Nurse Practitioner

## 2020-01-18 ENCOUNTER — Other Ambulatory Visit: Payer: Self-pay

## 2020-01-18 DIAGNOSIS — Z87891 Personal history of nicotine dependence: Secondary | ICD-10-CM | POA: Insufficient documentation

## 2020-01-18 DIAGNOSIS — Z122 Encounter for screening for malignant neoplasm of respiratory organs: Secondary | ICD-10-CM | POA: Diagnosis present

## 2020-01-20 ENCOUNTER — Ambulatory Visit: Payer: Medicare HMO

## 2020-01-21 ENCOUNTER — Encounter: Payer: Self-pay | Admitting: *Deleted

## 2021-01-23 ENCOUNTER — Other Ambulatory Visit: Payer: Self-pay | Admitting: *Deleted

## 2021-01-23 DIAGNOSIS — Z87891 Personal history of nicotine dependence: Secondary | ICD-10-CM

## 2021-01-30 ENCOUNTER — Other Ambulatory Visit: Payer: Self-pay

## 2021-01-30 ENCOUNTER — Ambulatory Visit
Admission: RE | Admit: 2021-01-30 | Discharge: 2021-01-30 | Disposition: A | Discharge status: Home / Self Care | Payer: Medicare Other | Source: Ambulatory Visit | Attending: Acute Care | Admitting: Acute Care

## 2021-01-30 DIAGNOSIS — Z87891 Personal history of nicotine dependence: Secondary | ICD-10-CM | POA: Insufficient documentation

## 2021-02-10 ENCOUNTER — Other Ambulatory Visit: Payer: Self-pay | Admitting: Internal Medicine

## 2021-02-10 DIAGNOSIS — K7460 Unspecified cirrhosis of liver: Secondary | ICD-10-CM

## 2021-02-23 ENCOUNTER — Other Ambulatory Visit: Payer: Self-pay | Admitting: Acute Care

## 2021-02-23 DIAGNOSIS — Z87891 Personal history of nicotine dependence: Secondary | ICD-10-CM

## 2021-02-23 NOTE — Progress Notes (Signed)
Please call patient and let them  know their  low dose Ct was read as a Lung RADS 2: nodules that are benign in appearance and behavior with a very low likelihood of becoming a clinically active cancer due to size or lack of growth. Recommendation per radiology is for a repeat LDCT in 12 months. .Please let them  know we will order and schedule their  annual screening scan for 01/2022. Please let them  know there was notation of CAD on their  scan.  Please remind the patient  that this is a non-gated exam therefore degree or severity of disease  cannot be determined. Please have them  follow up with their PCP regarding potential risk factor modification, dietary therapy or pharmacologic therapy if clinically indicated. Pt.  is  currently on statin therapy. Please place order for annual  screening scan for  01/2022 and fax results to PCP. Thanks so much.  Please let patient know there was a notation of possible pulmonary artery hypertension on this scan. Pulmonary arterial hypertension is a type of high blood pressure that affects the arteries in the lungs and the right side of the heart. This can cause the blood vessels that carry blood from your heart to your lungs can become hard and narrow.   Please follow up with your PCP regarding need for further work up, cardiology consult or echocardiogram.

## 2021-05-03 ENCOUNTER — Other Ambulatory Visit: Payer: Self-pay | Admitting: Orthopedic Surgery

## 2021-05-04 ENCOUNTER — Encounter
Admission: RE | Admit: 2021-05-04 | Discharge: 2021-05-04 | Disposition: A | Discharge status: Home / Self Care | Payer: Medicare Other | Source: Ambulatory Visit | Attending: Orthopedic Surgery | Admitting: Orthopedic Surgery

## 2021-05-04 ENCOUNTER — Other Ambulatory Visit: Payer: Self-pay

## 2021-05-04 DIAGNOSIS — Z01810 Encounter for preprocedural cardiovascular examination: Secondary | ICD-10-CM | POA: Diagnosis not present

## 2021-05-04 DIAGNOSIS — I10 Essential (primary) hypertension: Secondary | ICD-10-CM | POA: Diagnosis not present

## 2021-05-04 NOTE — Patient Instructions (Addendum)
Your procedure is scheduled on: Thursday May 18, 2021. Report to Day Surgery inside [facility] 2nd floor. To find out your arrival time please call [phone] between 1PM - 3PM on Wednesday May 17, 2021.  Remember: Instructions that are not followed completely may result in serious medical risk,  up to and including death, or upon the discretion of your surgeon and anesthesiologist your  surgery may need to be rescheduled.     _X__ 1. Do not eat food or drink fluids after midnight the night before your procedure.                 No chewing gum or hard candies.   __X__2.  On the morning of surgery brush your teeth with toothpaste and water, you                may rinse your mouth with mouthwash if you wish.  Do not swallow any toothpaste or mouthwash.     _X__ 3.  No Alcohol for 24 hours before or after surgery.   _X__ 4.  Do Not Smoke or use e-cigarettes For 24 Hours Prior to Your Surgery.                 Do not use any chewable tobacco products for at least 6 hours prior to                 Surgery.  _X__  5.  Do not use any recreational drugs (marijuana, cocaine, heroin, ecstasy, MDMA or other)                For at least one week prior to your surgery.  Combination of these drugs with anesthesia                May have life threatening results.   __X__6.  Notify your doctor if there is any change in your medical condition      (cold, fever, infections).     Do not wear jewelry, make-up, hairpins, clips or nail polish. Do not wear lotions, powders, or perfumes. You may wear deodorant. Do not shave 48 hours prior to surgery. Men may shave face and neck. Do not bring valuables to the hospital.    [facility] is not responsible for any belongings or valuables.  Contacts, dentures or bridgework may not be worn into surgery. Leave your suitcase in the car. After surgery it may be brought to your room. For patients admitted to the hospital,  discharge time is determined by your treatment team.   Patients discharged the day of surgery will not be allowed to drive home.   Make arrangements for someone to be with you for the first 24 hours of your Same Day Discharge.   __X__ Take these medicines the morning of surgery with A SIP OF WATER:    1. None   2.   3.   4.  5.  6.  ____ Fleet Enema (as directed)   __X__ Use CHG Soap (or wipes) as directed  ____ Use Benzoyl Peroxide Gel as instructed  ____ Use inhalers on the day of surgery  __X__ Stop metFORMIN (GLUCOPHAGE) 500 MG 2 days prior to surgery (take last dose Monday 05/15/20)    ____ Take 1/2 of usual insulin dose the night before surgery. No insulin the morning          of surgery.   ____ Call your PCP, cardiologist, or Pulmonologist if taking Coumadin/Plavix/aspirin and ask  when to stop before your surgery.   __X__ One Week prior to surgery- Stop Anti-inflammatories such as Ibuprofen, Aleve, Advil, Motrin, meloxicam (MOBIC), diclofenac, etodolac, ketorolac, Toradol, Daypro, piroxicam, Goody's or BC powders. OK TO USE TYLENOL IF NEEDED   __X__ Do not start any supplements until after surgery.    ____ Bring C-Pap to the hospital.    If you have any questions regarding your pre-procedure instructions,  Please call Pre-admit Testing at [phone]

## 2021-05-16 ENCOUNTER — Other Ambulatory Visit: Payer: Medicare Other

## 2021-05-18 ENCOUNTER — Ambulatory Visit: Payer: Medicare Other

## 2021-05-18 ENCOUNTER — Encounter
Admission: RE | Disposition: A | Discharge status: Home / Self Care | Payer: Self-pay | Source: Home / Self Care | Attending: Orthopedic Surgery

## 2021-05-18 ENCOUNTER — Ambulatory Visit: Payer: Medicare Other | Admitting: Anesthesiology

## 2021-05-18 ENCOUNTER — Ambulatory Visit
Admission: RE | Admit: 2021-05-18 | Discharge: 2021-05-18 | Disposition: A | Discharge status: Home / Self Care | Payer: Medicare Other | Attending: Orthopedic Surgery | Admitting: Orthopedic Surgery

## 2021-05-18 ENCOUNTER — Other Ambulatory Visit: Payer: Self-pay

## 2021-05-18 ENCOUNTER — Encounter: Payer: Self-pay | Admitting: Orthopedic Surgery

## 2021-05-18 DIAGNOSIS — E119 Type 2 diabetes mellitus without complications: Secondary | ICD-10-CM | POA: Diagnosis not present

## 2021-05-18 DIAGNOSIS — Z87891 Personal history of nicotine dependence: Secondary | ICD-10-CM | POA: Diagnosis not present

## 2021-05-18 DIAGNOSIS — M75101 Unspecified rotator cuff tear or rupture of right shoulder, not specified as traumatic: Secondary | ICD-10-CM | POA: Diagnosis not present

## 2021-05-18 DIAGNOSIS — Z419 Encounter for procedure for purposes other than remedying health state, unspecified: Secondary | ICD-10-CM

## 2021-05-18 HISTORY — PX: SHOULDER ARTHROSCOPY WITH OPEN ROTATOR CUFF REPAIR AND DISTAL CLAVICLE ACROMINECTOMY: SHX5683

## 2021-05-18 LAB — GLUCOSE, CAPILLARY
Glucose-Capillary: 121 mg/dL — ABNORMAL HIGH (ref 70–99)
Glucose-Capillary: 118 mg/dL — ABNORMAL HIGH (ref 70–99)

## 2021-05-18 SURGERY — SHOULDER ARTHROSCOPY WITH OPEN ROTATOR CUFF REPAIR AND DISTAL CLAVICLE ACROMINECTOMY
Anesthesia: Regional | Site: Shoulder | Laterality: Right

## 2021-05-18 MED ORDER — CEFAZOLIN SODIUM-DEXTROSE 2-4 GM/100ML-% IV SOLN
2.0000 g | INTRAVENOUS | Status: AC
  Administered 2021-05-18: 2 g via INTRAVENOUS

## 2021-05-18 MED ORDER — SODIUM CHLORIDE 0.9 % IR SOLN
Status: DC | PRN
  Administered 2021-05-18: 502 mL

## 2021-05-18 MED ORDER — EPINEPHRINE PF 1 MG/ML IJ SOLN
INTRAMUSCULAR | Status: AC
  Filled 2021-05-18: qty 4

## 2021-05-18 MED ORDER — FENTANYL CITRATE (PF) 100 MCG/2ML IJ SOLN
INTRAMUSCULAR | Status: AC
  Filled 2021-05-18: qty 2

## 2021-05-18 MED ORDER — MIDAZOLAM HCL 2 MG/2ML IJ SOLN: 1.0000 mg | INTRAMUSCULAR | Status: DC | PRN

## 2021-05-18 MED ORDER — PHENYLEPHRINE HCL-NACL 20-0.9 MG/250ML-% IV SOLN
INTRAVENOUS | Status: AC
  Filled 2021-05-18: qty 250

## 2021-05-18 MED ORDER — SUGAMMADEX SODIUM 200 MG/2ML IV SOLN
INTRAVENOUS | Status: DC | PRN
  Administered 2021-05-18: 200 mg via INTRAVENOUS

## 2021-05-18 MED ORDER — MIDAZOLAM HCL 2 MG/2ML IJ SOLN
INTRAMUSCULAR | Status: AC
  Administered 2021-05-18: 1 mg via INTRAVENOUS
  Filled 2021-05-18: qty 2

## 2021-05-18 MED ORDER — DEXAMETHASONE SODIUM PHOSPHATE 10 MG/ML IJ SOLN
INTRAMUSCULAR | Status: DC | PRN
  Administered 2021-05-18: 5 mg via INTRAVENOUS

## 2021-05-18 MED ORDER — DEXAMETHASONE SODIUM PHOSPHATE 10 MG/ML IJ SOLN
INTRAMUSCULAR | Status: AC
  Filled 2021-05-18: qty 1

## 2021-05-18 MED ORDER — CHLORHEXIDINE GLUCONATE CLOTH 2 % EX PADS
6.0000 | MEDICATED_PAD | Freq: Once | CUTANEOUS | Status: AC
Start: 2021-05-18 — End: 2021-05-18
  Administered 2021-05-18: 6 via TOPICAL

## 2021-05-18 MED ORDER — BUPIVACAINE HCL (PF) 0.5 % IJ SOLN
INTRAMUSCULAR | Status: AC
  Filled 2021-05-18: qty 10

## 2021-05-18 MED ORDER — PROPOFOL 10 MG/ML IV BOLUS
INTRAVENOUS | Status: AC
  Filled 2021-05-18: qty 20

## 2021-05-18 MED ORDER — CHLORHEXIDINE GLUCONATE 0.12 % MT SOLN
15.0000 mL | Freq: Once | OROMUCOSAL | Status: AC
  Administered 2021-05-18: 15 mL via OROMUCOSAL

## 2021-05-18 MED ORDER — BUPIVACAINE HCL (PF) 0.25 % IJ SOLN
INTRAMUSCULAR | Status: AC
  Filled 2021-05-18: qty 30

## 2021-05-18 MED ORDER — FENTANYL CITRATE (PF) 100 MCG/2ML IJ SOLN: 25.0000 ug | INTRAMUSCULAR | Status: DC | PRN

## 2021-05-18 MED ORDER — FENTANYL CITRATE PF 50 MCG/ML IJ SOSY
PREFILLED_SYRINGE | INTRAMUSCULAR | Status: AC
  Administered 2021-05-18: 50 ug via INTRAVENOUS
  Filled 2021-05-18: qty 2

## 2021-05-18 MED ORDER — BUPIVACAINE LIPOSOME 1.3 % IJ SUSP
INTRAMUSCULAR | Status: AC
  Filled 2021-05-18: qty 20

## 2021-05-18 MED ORDER — LIDOCAINE HCL (PF) 1 % IJ SOLN
INTRAMUSCULAR | Status: AC
  Filled 2021-05-18: qty 30

## 2021-05-18 MED ORDER — OXYCODONE HCL 5 MG/5ML PO SOLN: 5.0000 mg | Freq: Once | ORAL | Status: DC | PRN

## 2021-05-18 MED ORDER — SODIUM CHLORIDE 0.9 % IV SOLN: INTRAVENOUS | Status: DC

## 2021-05-18 MED ORDER — FENTANYL CITRATE (PF) 100 MCG/2ML IJ SOLN
INTRAMUSCULAR | Status: DC | PRN
Start: 2021-05-18 — End: 2021-05-18
  Administered 2021-05-18 (×2): 25 ug via INTRAVENOUS

## 2021-05-18 MED ORDER — ROCURONIUM BROMIDE 10 MG/ML (PF) SYRINGE
PREFILLED_SYRINGE | INTRAVENOUS | Status: AC
  Filled 2021-05-18: qty 10

## 2021-05-18 MED ORDER — CHLORHEXIDINE GLUCONATE CLOTH 2 % EX PADS
6.0000 | MEDICATED_PAD | Freq: Once | CUTANEOUS | Status: AC
  Administered 2021-05-18: 6 via TOPICAL

## 2021-05-18 MED ORDER — LACTATED RINGERS IR SOLN
Status: DC | PRN
Start: 2021-05-18 — End: 2021-05-18
  Administered 2021-05-18 (×8): 3000 mL

## 2021-05-18 MED ORDER — PROPOFOL 10 MG/ML IV BOLUS
INTRAVENOUS | Status: DC | PRN
Start: 2021-05-18 — End: 2021-05-18
  Administered 2021-05-18: 130 mg via INTRAVENOUS
  Administered 2021-05-18: 70 mg via INTRAVENOUS

## 2021-05-18 MED ORDER — LIDOCAINE HCL (CARDIAC) PF 100 MG/5ML IV SOSY
PREFILLED_SYRINGE | INTRAVENOUS | Status: DC | PRN
  Administered 2021-05-18: 80 mg via INTRAVENOUS

## 2021-05-18 MED ORDER — PHENYLEPHRINE HCL-NACL 20-0.9 MG/250ML-% IV SOLN
INTRAVENOUS | Status: DC | PRN
  Administered 2021-05-18: 20 ug/min via INTRAVENOUS

## 2021-05-18 MED ORDER — LIDOCAINE HCL (PF) 2 % IJ SOLN
INTRAMUSCULAR | Status: AC
  Filled 2021-05-18: qty 5

## 2021-05-18 MED ORDER — ONDANSETRON HCL 4 MG/2ML IJ SOLN
INTRAMUSCULAR | Status: AC
  Filled 2021-05-18: qty 2

## 2021-05-18 MED ORDER — ACETAMINOPHEN 10 MG/ML IV SOLN: 1000.0000 mg | Freq: Once | INTRAVENOUS | Status: DC | PRN

## 2021-05-18 MED ORDER — LACTATED RINGERS IR SOLN
Status: DC | PRN
  Administered 2021-05-18 (×4): 3001 mL

## 2021-05-18 MED ORDER — SODIUM CHLORIDE FLUSH 0.9 % IV SOLN
INTRAVENOUS | Status: AC
  Filled 2021-05-18: qty 30

## 2021-05-18 MED ORDER — ACETAMINOPHEN 500 MG PO TABS
1000.0000 mg | ORAL_TABLET | ORAL | Status: AC
  Administered 2021-05-18: 1000 mg via ORAL

## 2021-05-18 MED ORDER — ONDANSETRON HCL 4 MG PO TABS: 4.0000 mg | ORAL_TABLET | Freq: Three times a day (TID) | ORAL | 0 refills | Status: AC | PRN

## 2021-05-18 MED ORDER — LACTATED RINGERS IV SOLN: INTRAVENOUS | Status: DC

## 2021-05-18 MED ORDER — BUPIVACAINE LIPOSOME 1.3 % IJ SUSP
INTRAMUSCULAR | Status: DC | PRN
  Administered 2021-05-18: 20 mL

## 2021-05-18 MED ORDER — ONDANSETRON HCL 4 MG/2ML IJ SOLN
INTRAMUSCULAR | Status: DC | PRN
  Administered 2021-05-18: 4 mg via INTRAVENOUS

## 2021-05-18 MED ORDER — ONDANSETRON HCL 4 MG/2ML IJ SOLN: 4.0000 mg | Freq: Once | INTRAMUSCULAR | Status: DC | PRN

## 2021-05-18 MED ORDER — ROCURONIUM BROMIDE 100 MG/10ML IV SOLN
INTRAVENOUS | Status: DC | PRN
  Administered 2021-05-18: 20 mg via INTRAVENOUS
  Administered 2021-05-18: 10 mg via INTRAVENOUS
  Administered 2021-05-18: 50 mg via INTRAVENOUS
  Administered 2021-05-18: 20 mg via INTRAVENOUS

## 2021-05-18 MED ORDER — CHLORHEXIDINE GLUCONATE 0.12 % MT SOLN
OROMUCOSAL | Status: AC
  Filled 2021-05-18: qty 15

## 2021-05-18 MED ORDER — FAMOTIDINE 20 MG PO TABS
ORAL_TABLET | ORAL | Status: AC
  Filled 2021-05-18: qty 1

## 2021-05-18 MED ORDER — ORAL CARE MOUTH RINSE: 15.0000 mL | Freq: Once | OROMUCOSAL | Status: AC

## 2021-05-18 MED ORDER — FENTANYL CITRATE PF 50 MCG/ML IJ SOSY: 50.0000 ug | PREFILLED_SYRINGE | INTRAMUSCULAR | Status: DC | PRN

## 2021-05-18 MED ORDER — BUPIVACAINE HCL (PF) 0.5 % IJ SOLN
INTRAMUSCULAR | Status: DC | PRN
  Administered 2021-05-18: 10 mL

## 2021-05-18 MED ORDER — OXYCODONE HCL 5 MG PO TABS: 5.0000 mg | ORAL_TABLET | ORAL | 0 refills | Status: AC | PRN

## 2021-05-18 MED ORDER — CEFAZOLIN SODIUM-DEXTROSE 2-4 GM/100ML-% IV SOLN
INTRAVENOUS | Status: AC
  Filled 2021-05-18: qty 100

## 2021-05-18 MED ORDER — NEOMYCIN-POLYMYXIN B GU 40-200000 IR SOLN
Status: AC
  Filled 2021-05-18: qty 2

## 2021-05-18 MED ORDER — ACETAMINOPHEN 500 MG PO TABS
ORAL_TABLET | ORAL | Status: AC
  Filled 2021-05-18: qty 2

## 2021-05-18 MED ORDER — FAMOTIDINE 20 MG PO TABS
20.0000 mg | ORAL_TABLET | Freq: Once | ORAL | Status: AC
  Administered 2021-05-18: 20 mg via ORAL

## 2021-05-18 MED ORDER — OXYCODONE HCL 5 MG PO TABS: 5.0000 mg | ORAL_TABLET | Freq: Once | ORAL | Status: DC | PRN

## 2021-05-18 SURGICAL SUPPLY — 63 items
ADAPTER IRRIG TUBE 2 SPIKE SOL (ADAPTER) ×6 IMPLANT
ADPR TBG 2 SPK PMP STRL ASCP (ADAPTER) ×4
ANCH SUT 5.5 KNTLS PEEK (Orthopedic Implant) ×4 IMPLANT
ANCHOR SUT 5.5 MULTIFIX (Orthopedic Implant) ×4 IMPLANT
CANISTER SUCT LVC 12 LTR MEDI- (MISCELLANEOUS) ×1 IMPLANT
CANNULA 5.75X7 CRYSTAL CLEAR (CANNULA) ×4 IMPLANT
CANNULA PARTIAL THREAD 2X7 (CANNULA) ×1 IMPLANT
CANNULA TWIST IN 8.25X9CM (CANNULA) ×2 IMPLANT
CONNECTOR PERFECT PASSER (CONNECTOR) ×4 IMPLANT
COOLER POLAR GLACIER W/PUMP (MISCELLANEOUS) ×3 IMPLANT
DEVICE SUCT BLK HOLE OR FLOOR (MISCELLANEOUS) ×2 IMPLANT
DRAPE 3/4 80X56 (DRAPES) ×3 IMPLANT
DRAPE IMP U-DRAPE 54X76 (DRAPES) ×6 IMPLANT
DRAPE INCISE IOBAN 66X45 STRL (DRAPES) ×3 IMPLANT
DRAPE U-SHAPE 47X51 STRL (DRAPES) ×3 IMPLANT
DURAPREP 26ML APPLICATOR (WOUND CARE) ×11 IMPLANT
ELECT REM PT RETURN 9FT ADLT (ELECTROSURGICAL) ×3
ELECTRODE REM PT RTRN 9FT ADLT (ELECTROSURGICAL) ×2 IMPLANT
GAUZE SPONGE 4X4 12PLY STRL (GAUZE/BANDAGES/DRESSINGS) ×3 IMPLANT
GAUZE XEROFORM 1X8 LF (GAUZE/BANDAGES/DRESSINGS) ×3 IMPLANT
GLOVE SURG ORTHO LTX SZ9 (GLOVE) ×9 IMPLANT
GLOVE SURG UNDER POLY LF SZ9 (GLOVE) ×3 IMPLANT
GOWN STRL REUS TWL 2XL XL LVL4 (GOWN DISPOSABLE) ×3 IMPLANT
GOWN STRL REUS W/ TWL LRG LVL3 (GOWN DISPOSABLE) ×2 IMPLANT
GOWN STRL REUS W/TWL LRG LVL3 (GOWN DISPOSABLE) ×3
IV LACTATED RINGER IRRG 3000ML (IV SOLUTION) ×33
IV LR IRRIG 3000ML ARTHROMATIC (IV SOLUTION) ×19 IMPLANT
KIT STABILIZATION SHOULDER (MISCELLANEOUS) ×3 IMPLANT
KIT SUTURE 2.8 Q-FIX DISP (MISCELLANEOUS) ×1 IMPLANT
KIT SUTURETAK 3.0 INSERT PERC (KITS) IMPLANT
KIT TURNOVER KIT A (KITS) ×3 IMPLANT
MANIFOLD NEPTUNE II (INSTRUMENTS) ×4 IMPLANT
MASK FACE SPIDER DISP (MASK) ×3 IMPLANT
MAT ABSORB  FLUID 56X50 GRAY (MISCELLANEOUS) ×2
MAT ABSORB FLUID 56X50 GRAY (MISCELLANEOUS) ×5 IMPLANT
NDL SAFETY ECLIPSE 18X1.5 (NEEDLE) ×2 IMPLANT
NEEDLE HYPO 18GX1.5 SHARP (NEEDLE) ×3
NEEDLE HYPO 22GX1.5 SAFETY (NEEDLE) ×3 IMPLANT
NS IRRIG 500ML POUR BTL (IV SOLUTION) ×3 IMPLANT
PACK ARTHROSCOPY SHOULDER (MISCELLANEOUS) ×3 IMPLANT
PAD ABD DERMACEA PRESS 5X9 (GAUZE/BANDAGES/DRESSINGS) ×4 IMPLANT
PAD ARMBOARD 7.5X6 YLW CONV (MISCELLANEOUS) ×6 IMPLANT
PAD WRAPON POLAR SHDR XLG (MISCELLANEOUS) ×2 IMPLANT
SHAVER BLADE BONE CUTTER 4.5 (BLADE) ×3 IMPLANT
SHAVER BLADE TAPERED BLUNT 4 (BLADE) ×3 IMPLANT
SPONGE T-LAP 18X18 ~~LOC~~+RFID (SPONGE) ×3 IMPLANT
STRIP CLOSURE SKIN 1/2X4 (GAUZE/BANDAGES/DRESSINGS) ×3 IMPLANT
SUT ETHILON 4-0 (SUTURE) ×6
SUT ETHILON 4-0 FS2 18XMFL BLK (SUTURE) ×4
SUT MNCRL 4-0 (SUTURE) ×6
SUT MNCRL 4-0 27XMFL (SUTURE) ×4
SUT PERFECTPASSER WHITE CART (SUTURE) ×10 IMPLANT
SUT SMART STITCH CARTRIDGE (SUTURE) ×16 IMPLANT
SUT VIC AB 0 CT1 36 (SUTURE) ×5 IMPLANT
SUTURE ETHLN 4-0 FS2 18XMF BLK (SUTURE) ×3 IMPLANT
SUTURE MNCRL 4-0 27XMF (SUTURE) ×3 IMPLANT
SYR 10ML LL (SYRINGE) ×3 IMPLANT
TAPE MICROFOAM 4IN (TAPE) ×3 IMPLANT
TUBING CONNECTING 10 (TUBING) ×3 IMPLANT
TUBING INFLOW SET DBFLO PUMP (TUBING) ×3 IMPLANT
TUBING OUTFLOW SET DBLFO PUMP (TUBING) ×3 IMPLANT
WAND WEREWOLF FLOW 90D (MISCELLANEOUS) ×3 IMPLANT
WRAPON POLAR PAD SHDR XLG (MISCELLANEOUS) ×3

## 2021-05-18 NOTE — Anesthesia Procedure Notes (Signed)
Procedure Name: Intubation Date/Time: 05/18/2021 10:10 AM Performed by: Loletha Grayer, CRNA Pre-anesthesia Checklist: Patient identified, Patient being monitored, Timeout performed, Emergency Drugs available and Suction available Patient Re-evaluated:Patient Re-evaluated prior to induction Oxygen Delivery Method: Circle system utilized Preoxygenation: Pre-oxygenation with 100% oxygen Induction Type: IV induction Ventilation: Mask ventilation without difficulty Laryngoscope Size: McGraph and 4 Grade View: Grade I Tube type: Oral Tube size: 7.0 mm Number of attempts: 1 Airway Equipment and Method: Stylet Placement Confirmation: ETT inserted through vocal cords under direct vision, positive ETCO2 and breath sounds checked- equal and bilateral Secured at: 22 cm Tube secured with: Tape Dental Injury: Teeth and Oropharynx as per pre-operative assessment

## 2021-05-18 NOTE — Op Note (Signed)
05/18/2021  2:47 PM  PATIENT:  Eddie Sandoval  76 y.o. male  PRE-OPERATIVE DIAGNOSIS: Large retracted right Shoulder Rotator Cuff Tear  POST-OPERATIVE DIAGNOSIS: Large V-shaped and retracted tear involving the supra and infraspinatus tendons.  SLAP tear.  High-grade partial tear of the head of the biceps tendon.  AC joint arthrosis.  Subacromial impingement.  PROCEDURE:  Procedure(s): RIGHT SHOULDER ARTHROSCOPIC SUBACROMIAL DECOMPRESSION, DISTAL CLAVICLE EXCISION,  BICEPS TENOTOMY  AND MINI-OPEN ROTATOR CUFF REPAIR  SURGEON:  Surgeon(s) and Role:    * [place] Park, MD - Primary  ANESTHESIA:   general and paracervical block   PREOPERATIVE INDICATIONS:  Eddie Sandoval is a  76 y.o. male with a diagnosis of large, retracted right Shoulder Rotator Cuff Tear confirmed by MRI who failed conservative measures and elected for surgical management.    The risks benefits and alternatives were discussed with the patient preoperatively including but not limited to the risks of infection, bleeding, nerve injury, persistent pain or weakness, failure of the hardware, re-tear of the rotator cuff and the need for further surgery. Medical risks include DVT and pulmonary embolism, myocardial infarction, stroke, pneumonia, respiratory failure and death. Patient understood these risks and wished to proceed.  OPERATIVE IMPLANTS: [place] MultiFix anchors x2  OPERATIVE PROCEDURE: The patient was met in the preoperative area. The right shoulder was signed with the word yes and my initials according the hospital's correct site of surgery protocol.  Patient underwent an interscalene block with Exparel in the preoperative area by the anesthesia service.  The patient is brought to the OR and underwent general endotracheal intubation by the anesthesia service.  The patient was placed in a beachchair position.  A spider arm positioner was used for this case. Examination under anesthesia revealed no passive  loss of motion and no instability to load-and-shift testing.  Patient had a negative sulcus sign.  The patient was prepped and draped in a sterile fashion. A timeout was performed to verify the patient's name, date of birth, medical record number, correct site of surgery and correct procedure to be performed there was also used to verify the patient received antibiotics that all appropriate instruments, implants and radiographs studies were available in the room. Once all in attendance were in agreement case began.  Bony landmarks were drawn out with a surgical marker along with proposed arthroscopy incisions. These were pre-injected with 1% lidocaine plain. An 11 blade was used to establish a posterior portal through which the arthroscope was placed in the glenohumeral joint. A full diagnostic examination of the shoulder was performed.  The anterior portal was established under direct visualization with an 18-gauge spinal needle.  A 5.75 mm arthroscopic cannula was placed through the anterior portal.   The found to have extensive partial tearing involving greater than 50% intra-articular portion of the biceps tendon.  Patient was also found to have a SLAP tear.  Therefore the decision was made to perform a tenotomy. An arthroscopic scissor was used to release the biceps tendon off the superior labrum. The arthroscopic shaver was then used to debride the frayed edges of the labrum.  Subscapularis tendon was intact. Patient had a large, V-shaped  full-thickness tear involving the supraspinatus and infraspinatus tendons with retraction. There were no loose bodies within the inferior recess and no evidence of HAGL lesion.  The arthroscope was then placed in the subacromial space. A lateral portal was then established using an 18-gauge spinal needle for localization.   The greater tuberosity was  debrided using a 4.5 mm Dyonics bone cutter shaver blade to remove all remaining foreign fibers of the rotator cuff.   Debridement was performed until punctate bleeding was seen at the greater tuberosity footprint, which will allow for rotator cuff healing.  Extensive bursitis was encountered and debrided using a Dyonics flyer shaver blade and a 90 ArthroCare wand from the lateral portal. A subacromial decompression was also performed using a 4.5 mm resector shaver blade from the lateral portal.  The 4.5 mm bone cutter shaver blade was then placed through the anterior portal and distal clavicle excision was performed.    And anterior lateral portal was created using an 18-gauge spinal needle.  The arthroscope was placed through the lateral portal and Smith [employer] Nephew perfect Pass sutures were then used to fix the V-shaped tear and a side to side fashion using 5 perfect pass sutures through the anterolateral portal.  Four additional perfect pass sutures were placed in the lateral border of the rotator cuff tear. All arthroscopic instruments were then removed and the mini-open portion of the procedure began.   A saber-type incision was made along the lateral border of the acromion. The deltoid muscle was identified and split in line with its fibers which allowed visualization of the rotator cuff.  The Perfect Pass sutures previously placed in the lateral border of the rotator cuff were also brought out through the deltoid split.  The Perfect Pass sutures from the lateral border of the rotator cuff were then anchored to the greater tuberosity of the humeral head using two [place] MultiFix anchors. These anchors were tensioned to allow for anatomic reduction of the rotator cuff to the greater tuberosity footprint.   Final images of the repair were made with an arthroscope through the deltoid incision.    All incisions were copiously irrigated. The deltoid fascia was repaired using a 0 Vicryl suturean interrupted fashion.  The subcutaneous tissue of all incisions were closed with a 2-0 Vicryl. Skin closure for the  arthroscopic incisions was performed with 4-0 nylon. The skin edges of the saber incision were approximated with a running 4-0 undyed Monocryl.   A dry sterile dressing including Steri-Strips was applied .  The patient was placed in an abduction sling, with a Polar Care sleeve.  All sharp and instrument counts were correct at the conclusion of the case. I was scrubbed and present for the entire case. I spoke with the patient's daughter by phone from the PACU while she was waiting in her car, not the waiting room.  I informed her that the case had been performed without complication and the patient was stable in recovery room.  I reviewed the postoperative instructions with her and answered all her questions.    Timoteo Gaul, MD

## 2021-05-18 NOTE — Discharge Instructions (Addendum)
AMBULATORY SURGERY  DISCHARGE INSTRUCTIONS   The drugs that you were given will stay in your system until tomorrow so for the next 24 hours you should not:  Drive an automobile Make any legal decisions Drink any alcoholic beverage   You may resume regular meals tomorrow.  Today it is better to start with liquids and gradually work up to solid foods.  You may eat anything you prefer, but it is better to start with liquids, then soup and crackers, and gradually work up to solid foods.   Please notify your doctor immediately if you have any unusual bleeding, trouble breathing, redness and pain at the surgery site, drainage, fever, or pain not relieved by medication.    Additional Instructions: May take ibuprofen 800mg  every 8 hours alternately with the tylenol    Please contact your physician with any problems or Same Day Surgery at [phone], Monday through Friday 6 am to 4 pm, or [place] at [facility] number at [phone].      Interscalene Nerve Block with Exparel   Leave green arm band on for 4 days   For your surgery you have received an Interscalene Nerve Block with Exparel. Nerve Blocks affect many types of nerves, including nerves that control movement, pain and normal sensation.  You may experience feelings such as numbness, tingling, heaviness, weakness or the inability to move your arm or the feeling or sensation that your arm has "fallen asleep". A nerve block with Exparel can last up to 5 days.  Usually the weakness wears off first.  The tingling and heaviness usually wear off next.  Finally you may start to notice pain.  Keep in mind that this may occur in any order.  Once a nerve block starts to wear off it is usually completely gone within 60 minutes. ISNB may cause mild shortness of breath, a hoarse voice, blurry vision, unequal pupils, or drooping of the face on the same side as the nerve block.  These symptoms will usually resolve with the numbness.  Very  rarely the procedure itself can cause mild seizures. If needed, your surgeon will give you a prescription for pain medication.  It will take about 60 minutes for the oral pain medication to become fully effective.  So, it is recommended that you start taking this medication before the nerve block first begins to wear off, or when you first begin to feel discomfort. Take your pain medication only as prescribed.  Pain medication can cause sedation and decrease your breathing if you take more than you need for the level of pain that you have. Nausea is a common side effect of many pain medications.  You may want to eat something before taking your pain medicine to prevent nausea. After an Interscalene nerve block, you cannot feel pain, pressure or extremes in temperature in the effected arm.  Because your arm is numb it is at an increased risk for injury.  To decrease the possibility of injury, please practice the following:  While you are awake change the position of your arm frequently to prevent too much pressure on any one area for prolonged periods of time.  If you have a cast or tight dressing, check the color or your fingers every couple of hours.  Call your surgeon with the appearance of any discoloration (white or blue). If you are given a sling to wear before you go home, please wear it  at all times until the block has completely worn off.  Do not get up at night without your sling. Please contact [facility] or your surgeon if you do not begin to regain sensation after 7 days from the surgery.  Anesthesia may be contacted by calling the Same Day Surgery Department, Mon. through Fri., 6 am to 4 pm at [phone].   If you experience any other problems or concerns, please contact your surgeon's office. If you experience severe or prolonged shortness of breath go to the nearest emergency department.   POLAR CARE INFORMATION  [email or web address]  How to use [place] Cold Therapy  System?   YouTube   [email or web address]  OPERATING INSTRUCTIONS  Start the product With dry hands, connect the transformer to the electrical connection located on the top of the cooler. Next, plug the transformer into an appropriate electrical outlet. The unit will automatically start running at this point.  To stop the pump, disconnect electrical power.  Unplug to stop the product when not in use. Unplugging the Polar Care unit turns it off. Always unplug immediately after use. Never leave it plugged in while unattended. Remove pad.    FIRST ADD WATER TO FILL LINE, THEN ICE---Replace ice when existing ice is almost melted  1 Discuss Treatment with your [place] Practitioner and Use Only as Prescribed 2 Apply Insulation Barrier & Cold Therapy Pad 3 Check for Moisture 4 Inspect Skin Regularly  Tips and Trouble Shooting Usage Tips 1. Use cubed or chunked ice for optimal performance. 2. It is recommended to drain the Pad between uses. To drain the pad, hold the Pad upright with the hose pointed toward the ground. Depress the black plunger and allow water to drain out. 3. You may disconnect the Pad from the unit without removing the pad from the affected area by depressing the silver tabs on the hose coupling and gently pulling the hoses apart. The Pad and unit will seal itself and will not leak. Note: Some dripping during release is normal. 4. DO NOT RUN PUMP WITHOUT WATER! The pump in this unit is designed to run with water. Running the unit without water will cause permanent damage to the pump. 5. Unplug unit before removing lid.  TROUBLESHOOTING GUIDE Pump not running, Water not flowing to the pad, Pad is not getting cold 1. Make sure the transformer is plugged into the wall outlet. 2. Confirm that the ice and water are filled to the indicated levels. 3. Make sure there are no kinks in the pad. 4. Gently pull on the blue tube to make sure the tube/pad  junction is straight. 5. Remove the pad from the treatment site and ll it while the pad is lying at; then reapply. 6. Confirm that the pad couplings are securely attached to the unit. Listen for the double clicks (Figure 1) to confirm the pad couplings are securely attached.  Leaks    Note: Some condensation on the lines, controller, and pads is unavoidable, especially in warmer climates. 1. If using a [place] Polar Care Cold Therapy unit with a detachable Cold Therapy Pad, and a leak exists (other than condensation on the lines) disconnect the pad couplings. Make sure the silver tabs on the couplings are depressed before reconnecting the pad to the pump hose; then confirm both sides of the coupling are properly clicked in. 2. If the coupling continues to leak or a leak is detected in the pad itself, stop using it and call [place] at [phone].  Cleaning After use, empty and  dry the unit with a soft cloth. Warm water and mild detergent may be used occasionally to clean the pump and tubes.  WARNING: The [place] can be cold enough to cause serious injury, including full skin necrosis. Follow these Operating Instructions, and carefully read the Product Insert (see pouch on side of unit) and the Cold Therapy Pad Fitting Instructions (provided with each Cold Therapy Pad) prior to use.    SHOULDER SLING IMMOBILIZER   VIDEO Slingshot 2 Shoulder Brace Application - YouTube ---[email or web address]  INSTRUCTIONS While supporting the injured arm, slide the forearm into the sling. Wrap the adjustable shoulder strap around the neck and shoulders and attach the strap end to the sling using  the alligator strap tab.  Adjust the shoulder strap to the required length. Position the shoulder pad behind the neck. To secure the shoulder pad location (optional), pull the shoulder strap away from the shoulder pad, unfold the hook material on the top of the pad, then  press the shoulder strap back onto the hook material to secure the pad in place. Attach the closure strap across the open top of the sling. Position the strap so that it holds the arm securely in the sling. Next, attach the thumb strap to the open end of the sling between the thumb and fingers. After sling has been fit, it may be easily removed and reapplied using the quick release buckle on shoulder strap. If a neutral pillow or 15 abduction pillow is included, place the pillow at the waistline. Attach the sling to the pillow, lining up hook material on the pillow with the loop on sling. Adjust the waist strap to fit.  If waist strap is too long, cut it to fit. Use the small piece of double sided hook material (located on top of the pillow) to secure the strap end. Place the double sided hook material on the inside of the cut strap end and secure it to the waist strap.     If no pillow is included, attach the waist strap to the sling and adjust to fit.    Washing Instructions: Straps and sling must be removed and cleaned regularly depending on your activity level and perspiration. Hand wash straps and sling in cold water with mild detergent, rinse, air dry

## 2021-05-18 NOTE — Transfer of Care (Signed)
Immediate Anesthesia Transfer of Care Note  Patient: Eddie Sandoval  Procedure(s) Performed: SHOULDER ARTHROSCOPY WITH OPEN ROTATOR CUFF REPAIR AND DISTAL CLAVICLE EXCISION, SUBACROMIAL DECOMPRESSION, BICEPS TENOTOMY (Right: Shoulder)  Patient Location: PACU  Anesthesia Type:GA combined with regional for post-op pain  Level of Consciousness: drowsy  Airway & Oxygen Therapy: Patient Spontanous Breathing and Patient connected to face mask oxygen  Post-op Assessment: Report given to RN and Post -op Vital signs reviewed and stable  Post vital signs: Reviewed and stable  Last Vitals:  Vitals Value Taken Time  BP 119/69 05/18/21 1321  Temp 36.2 C 05/18/21 1321  Pulse 74 05/18/21 1324  Resp 13 05/18/21 1324  SpO2 100 % 05/18/21 1324  Vitals shown include unvalidated device data.  Last Pain:  Vitals:   05/18/21 0905  TempSrc: Temporal  PainSc: 3          Complications: No notable events documented.

## 2021-05-18 NOTE — Anesthesia Postprocedure Evaluation (Signed)
Anesthesia Post Note  Patient: Eddie Sandoval  Procedure(s) Performed: SHOULDER ARTHROSCOPY WITH OPEN ROTATOR CUFF REPAIR AND DISTAL CLAVICLE EXCISION, SUBACROMIAL DECOMPRESSION, BICEPS TENOTOMY (Right: Shoulder)  Patient location during evaluation: PACU Anesthesia Type: Regional and General Level of consciousness: awake and alert, oriented and patient cooperative Pain management: pain level controlled Vital Signs Assessment: post-procedure vital signs reviewed and stable Respiratory status: spontaneous breathing, nonlabored ventilation and respiratory function stable Cardiovascular status: blood pressure returned to baseline and stable Postop Assessment: adequate PO intake Anesthetic complications: no   No notable events documented.   Last Vitals:  Vitals:   05/18/21 1415 05/18/21 1432  BP: 140/78 (!) 144/82  Pulse: 82 87  Resp: 14 16  Temp: 36.6 C (!) 36.1 C  SpO2: 94% 91%    Last Pain:  Vitals:   05/18/21 1432  TempSrc: Temporal  PainSc: 0-No pain                 Darrin Nipper

## 2021-05-18 NOTE — Anesthesia Preprocedure Evaluation (Addendum)
Anesthesia Evaluation  Patient identified by MRN, date of birth, ID band Patient awake    Reviewed: Allergy & Precautions, NPO status , Patient's Chart, lab work & pertinent test results  History of Anesthesia Complications Negative for: history of anesthetic complications  Airway Mallampati: III   Neck ROM: Full    Dental  (+) Upper Dentures, Lower Dentures Three lower teeth intact:   Pulmonary COPD, former smoker (quit 2017),    Pulmonary exam normal breath sounds clear to auscultation       Cardiovascular hypertension, Normal cardiovascular exam Rhythm:Regular Rate:Normal  ECG 05/04/21:  Sinus rhythm with Premature supraventricular complexes Nonspecific T wave abnormality  Echo 02/17/18:  NORMAL LEFT VENTRICULAR SYSTOLIC FUNCTION  NORMAL RIGHT VENTRICULAR SYSTOLIC FUNCTION  MILD VALVULAR REGURGITATION (See above)  MILD VALVULAR STENOSIS (See above)  Mild AS/AI, 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     Neuro/Psych negative neurological ROS     GI/Hepatic negative GI ROS, Fatty liver   Endo/Other  diabetes, Type 2  Renal/GU negative Renal ROS     Musculoskeletal   Abdominal   Peds  Hematology negative hematology ROS (+)   Anesthesia Other Findings   Reproductive/Obstetrics                            Anesthesia Physical Anesthesia Plan  ASA: 2  Anesthesia Plan: General and Regional   Post-op Pain Management: Regional block   Induction: Intravenous  PONV Risk Score and Plan: 2 and Ondansetron, Dexamethasone and Treatment may vary due to age or medical condition  Airway Management Planned: Oral ETT  Additional Equipment:   Intra-op Plan:   Post-operative Plan: Extubation in OR  Informed Consent: I have reviewed the patients History and Physical, chart, labs and discussed the procedure including the risks, benefits and alternatives for the proposed anesthesia with the patient or  authorized representative who has indicated his/her understanding and acceptance.     Dental advisory given  Plan Discussed with: CRNA  Anesthesia Plan Comments: (Plan for preoperative interscalene nerve block and GETA.  Patient consented for risks of anesthesia including but not limited to:  - adverse reactions to medications - damage to eyes, teeth, lips or other oral mucosa - nerve damage due to positioning  - sore throat or hoarseness - damage to heart, brain, nerves, lungs, other parts of body or loss of life  Informed patient about role of CRNA in peri- and intra-operative care.  Patient voiced understanding.)        Anesthesia Quick Evaluation

## 2021-05-18 NOTE — H&P (Signed)
PREOPERATIVE H&P  Chief Complaint: Right Shoulder Rotator Cuff Tear  HPI: Eddie Sandoval is a 76 y.o. male who presents for preoperative history and physical with a diagnosis of large retracted Right Shoulder Rotator Cuff Tear confirmed by MRI. Symptoms of pain, weakness and limited ROM are significantly impairing activities of daily living.  He has failed non-operative treatment and agreed with surgical management.   Past Medical History:  Diagnosis Date   Adenomatous colon polyp    Aortic atherosclerosis (HCC)    COPD (chronic obstructive pulmonary disease) (HCC)    Diabetes mellitus without complication ([facility])    H/O: rheumatic fever    Herpes    Hypertension    Past Surgical History:  Procedure Laterality Date   COLONOSCOPY WITH PROPOFOL N/A 12/09/2017   Procedure: COLONOSCOPY WITH PROPOFOL;  Surgeon: Lollie Sails, MD;  Location: [facility] ENDOSCOPY;  Service: Endoscopy;  Laterality: N/A;   Social History   Socioeconomic History   Marital status: Married    Spouse name: Not on file   Number of children: Not on file   Years of education: Not on file   Highest education level: Not on file  Occupational History   Not on file  Tobacco Use   Smoking status: Former   Smokeless tobacco: Not on file  Vaping Use   Vaping Use: Every day   Substances: Nicotine  Substance and Sexual Activity   Alcohol use: No   Drug use: No   Sexual activity: Not on file  Other Topics Concern   Not on file  Social History Narrative   Not on file   Social Determinants of Health   Financial Resource Strain: Not on file  Food Insecurity: Not on file  Transportation Needs: Not on file  Physical Activity: Not on file  Stress: Not on file  Social Connections: Not on file   History reviewed. No pertinent family history. Allergies  Allergen Reactions   Azithromycin Other (See Comments)    Tongue pain Mouth sores    Tadalafil Other (See Comments)    Facial flushing   Prior to Admission  medications   Medication Sig Start Date End Date Taking? Authorizing Provider  acetaminophen (TYLENOL) 500 MG tablet Take 1,000 mg by mouth every 6 (six) hours as needed for mild pain or moderate pain.   Yes [provider]  amLODipine (NORVASC) 10 MG tablet Take 10 mg by mouth daily. 01/20/21  Yes [provider]  atorvastatin (LIPITOR) 10 MG tablet Take 10 mg by mouth daily.   Yes [provider]  lisinopril (ZESTRIL) 40 MG tablet Take 40 mg by mouth daily. 03/27/21  Yes [provider]  metFORMIN (GLUCOPHAGE) 500 MG tablet Take 500 mg by mouth daily with breakfast. For diabetes   Yes [provider]  traMADol (ULTRAM) 50 MG tablet Take 50 mg by mouth every 6 (six) hours as needed for moderate pain or severe pain.   Yes [provider]  aspirin EC 81 MG tablet Take 81 mg by mouth daily.    [provider]  polyethylene glycol-electrolytes (NULYTELY/GOLYTELY) 420 g solution Take 4,000 mLs by mouth once. Patient not taking: Reported on 05/04/2021    [provider]     Positive ROS: All other systems have been reviewed and were otherwise negative with the exception of those mentioned in the HPI and as above.  Physical Exam: General: Alert, no acute distress Cardiovascular: Regular rate and rhythm, no murmurs rubs or gallops.  No pedal  edema Respiratory: Clear to auscultation bilaterally, no wheezes rales or rhonchi. No cyanosis, no use of accessory musculature GI: No organomegaly, abdomen is soft and non-tender nondistended with positive bowel sounds. Skin: Skin intact, no lesions within the operative field. Neurologic: Sensation intact distally Psychiatric: Patient is competent for consent with normal mood and affect Lymphatic: No cervical lymphadenopathy  MUSCULOSKELETAL: Right Shoulder: The patient can forward elevate and abduct to only approximately 90 degrees. He had mild weakness with shoulder abduction, but  significant weakness of shoulder external rotation today. He has positive impingement signs, but no apprehension or instability. He has full digital, wrist and elbow range of motion, intact sensation to light touch and a palpable radial pulse.   Radiology: I reviewed the MRI images as well as radiology report. The patient has a large rotator cuff tear involving the supra and infraspinatus tendons with retraction. He had moderate bursitis as well. He has moderate tendinopathy of his long head of the biceps tendon, which may be partially torn. The patient also has AC joint arthropathy and a low lying acromion consistent with impingement. He has a lipoma in the posterior deltoid muscle.   Assessment: Right Shoulder Rotator Cuff Tear  Plan: Plan for Procedure(s): Right shoulder arthroscopic subacromial decompression, distal clavicle excision and mini-open rotator cuff repair with possible biceps tenotomy vs. Tenodesis  I have reviewed the details of the operation and post-op course with the patient and answered all his questions.  A pre-op H&P was performed at the bedside.  I marked the right shoulder according to the hospital's correct site of surgery protocol.    I discussed the risks and benefits of surgery. The risks include but are not limited to infection, bleeding, nerve or blood vessel injury, joint stiffness or loss of motion, persistent pain, weakness or instability, retear of the rotator cuff failure of the repair, hardware failure and the need for further surgery. Medical risks include but are not limited to DVT and pulmonary embolism, myocardial infarction, stroke, pneumonia, respiratory failure and death. Patient understood these risks and wished to proceed.     Thornton Park, MD   05/18/2021 9:50 AM

## 2021-05-18 NOTE — Anesthesia Procedure Notes (Signed)
Anesthesia Regional Block: Interscalene brachial plexus block   Pre-Anesthetic Checklist: , timeout performed,  Correct Patient, Correct Site, Correct Laterality,  Correct Procedure,, site marked,  Risks and benefits discussed,  Surgical consent,  Pre-op evaluation,  At surgeon's request and post-op pain management  Laterality: Right  Prep: chloraprep       Needles:  Injection technique: Single-shot  Needle Type: Echogenic Needle          Additional Needles:   Procedures:,,,, ultrasound used (permanent image in chart),,   Motor weakness within 20 minutes.  Narrative:  Start time: 05/18/2021 9:27 AM End time: 05/18/2021 9:30 AM Injection made incrementally with aspirations every 5 mL.  Performed by: Personally  Anesthesiologist: Darrin Nipper, MD  Additional Notes: Functioning IV was confirmed and monitors applied.  Sterile prep and drape, hand hygiene and sterile gloves were used. Ultrasound guidance: relevant anatomy identified, needle position confirmed, local anesthetic spread visualized around nerve(s), vascular puncture avoided.  Image saved to electronic medical record.  Negative aspiration prior to incremental administration of local anesthetic for total 20 ml Exparel and 10 ml bupivacaine 0.5% given in interscalene distribution. The patient tolerated the procedure well. Vital signs and moderate sedation medications recorded in RN notes.

## 2021-05-19 ENCOUNTER — Encounter: Payer: Self-pay | Admitting: Orthopedic Surgery

## 2022-01-30 ENCOUNTER — Ambulatory Visit
Admission: RE | Admit: 2022-01-30 | Discharge: 2022-01-30 | Disposition: A | Discharge status: Home / Self Care | Payer: Medicare Other | Source: Ambulatory Visit | Attending: Internal Medicine | Admitting: Internal Medicine

## 2022-01-30 DIAGNOSIS — Z87891 Personal history of nicotine dependence: Secondary | ICD-10-CM | POA: Insufficient documentation

## 2022-02-01 ENCOUNTER — Other Ambulatory Visit: Payer: Self-pay

## 2022-02-01 DIAGNOSIS — Z122 Encounter for screening for malignant neoplasm of respiratory organs: Secondary | ICD-10-CM

## 2022-02-01 DIAGNOSIS — Z87891 Personal history of nicotine dependence: Secondary | ICD-10-CM

## 2022-02-13 ENCOUNTER — Other Ambulatory Visit: Payer: Self-pay | Admitting: Internal Medicine

## 2022-02-13 DIAGNOSIS — K7460 Unspecified cirrhosis of liver: Secondary | ICD-10-CM

## 2022-02-15 ENCOUNTER — Ambulatory Visit
Admission: RE | Admit: 2022-02-15 | Discharge: 2022-02-15 | Disposition: A | Discharge status: Home / Self Care | Payer: Medicare Other | Source: Ambulatory Visit | Attending: Internal Medicine | Admitting: Internal Medicine

## 2022-02-15 DIAGNOSIS — K7460 Unspecified cirrhosis of liver: Secondary | ICD-10-CM

## 2022-09-17 ENCOUNTER — Encounter: Payer: Self-pay | Admitting: *Deleted

## 2022-09-18 ENCOUNTER — Other Ambulatory Visit: Payer: Self-pay

## 2022-09-18 ENCOUNTER — Ambulatory Visit
Admission: RE | Admit: 2022-09-18 | Discharge: 2022-09-18 | Disposition: A | Discharge status: Home / Self Care | Payer: Medicare Other | Attending: Gastroenterology | Admitting: Gastroenterology

## 2022-09-18 ENCOUNTER — Ambulatory Visit: Payer: Medicare Other | Admitting: Anesthesiology

## 2022-09-18 ENCOUNTER — Encounter: Payer: Self-pay | Admitting: *Deleted

## 2022-09-18 ENCOUNTER — Encounter
Admission: RE | Disposition: A | Discharge status: Home / Self Care | Payer: Self-pay | Source: Home / Self Care | Attending: Gastroenterology

## 2022-09-18 DIAGNOSIS — K635 Polyp of colon: Secondary | ICD-10-CM | POA: Diagnosis not present

## 2022-09-18 DIAGNOSIS — Z800 Family history of malignant neoplasm of digestive organs: Secondary | ICD-10-CM | POA: Diagnosis not present

## 2022-09-18 DIAGNOSIS — K640 First degree hemorrhoids: Secondary | ICD-10-CM | POA: Diagnosis not present

## 2022-09-18 DIAGNOSIS — Z1211 Encounter for screening for malignant neoplasm of colon: Secondary | ICD-10-CM | POA: Insufficient documentation

## 2022-09-18 DIAGNOSIS — D122 Benign neoplasm of ascending colon: Secondary | ICD-10-CM | POA: Diagnosis not present

## 2022-09-18 DIAGNOSIS — Z86010 Personal history of colonic polyps: Secondary | ICD-10-CM | POA: Diagnosis not present

## 2022-09-18 HISTORY — PX: COLONOSCOPY WITH PROPOFOL: SHX5780

## 2022-09-18 LAB — GLUCOSE, CAPILLARY: Glucose-Capillary: 87 mg/dL (ref 70–99)

## 2022-09-18 SURGERY — COLONOSCOPY WITH PROPOFOL
Anesthesia: General

## 2022-09-18 MED ORDER — LIDOCAINE HCL (CARDIAC) PF 100 MG/5ML IV SOSY
PREFILLED_SYRINGE | INTRAVENOUS | Status: DC | PRN
  Administered 2022-09-18: 60 mg via INTRAVENOUS

## 2022-09-18 MED ORDER — SODIUM CHLORIDE 0.9 % IV SOLN: INTRAVENOUS | Status: DC

## 2022-09-18 MED ORDER — PROPOFOL 10 MG/ML IV BOLUS
INTRAVENOUS | Status: DC | PRN
  Administered 2022-09-18 (×2): 20 mg via INTRAVENOUS

## 2022-09-18 MED ORDER — PROPOFOL 500 MG/50ML IV EMUL
INTRAVENOUS | Status: DC | PRN
  Administered 2022-09-18: 75 ug/kg/min via INTRAVENOUS

## 2022-09-18 MED ORDER — LIDOCAINE HCL (PF) 2 % IJ SOLN
INTRAMUSCULAR | Status: AC
  Filled 2022-09-18: qty 5

## 2022-09-18 NOTE — Interval H&P Note (Signed)
History and Physical Interval Note:  09/18/2022 9:44 AM  Eddie Sandoval  has presented today for surgery, with the diagnosis of hx of adenomatous polyp of colon.  The various methods of treatment have been discussed with the patient and family. After consideration of risks, benefits and other options for treatment, the patient has consented to  Procedure(s): COLONOSCOPY WITH PROPOFOL (N/A) as a surgical intervention.  The patient's history has been reviewed, patient examined, no change in status, stable for surgery.  I have reviewed the patient's chart and labs.  Questions were answered to the patient's satisfaction.     Regis Bill  Ok to proceed with colonoscopy

## 2022-09-18 NOTE — Anesthesia Postprocedure Evaluation (Signed)
Anesthesia Post Note  Patient: Eddie Sandoval  Procedure(s) Performed: COLONOSCOPY WITH PROPOFOL  Patient location during evaluation: Endoscopy Anesthesia Type: General Level of consciousness: awake and alert Pain management: pain level controlled Vital Signs Assessment: post-procedure vital signs reviewed and stable Respiratory status: spontaneous breathing, nonlabored ventilation, respiratory function stable and patient connected to nasal cannula oxygen Cardiovascular status: blood pressure returned to baseline and stable Postop Assessment: no apparent nausea or vomiting Anesthetic complications: no   No notable events documented.   Last Vitals:  Vitals:   09/18/22 1027 09/18/22 1037  BP: 131/79 137/82  Pulse: 66 63  Resp: (!) 21 17  Temp:    SpO2: 98% 100%    Last Pain:  Vitals:   09/18/22 1037  TempSrc:   PainSc: 0-No pain                 Lenard Simmer

## 2022-09-18 NOTE — Anesthesia Preprocedure Evaluation (Signed)
Anesthesia Evaluation  Patient identified by MRN, date of birth, ID band Patient awake    Reviewed: Allergy & Precautions, NPO status , Patient's Chart, lab work & pertinent test results  History of Anesthesia Complications Negative for: history of anesthetic complications  Airway Mallampati: III   Neck ROM: Full    Dental  (+) Upper Dentures, Lower Dentures, Dental Advidsory Given Three lower teeth intact:   Pulmonary neg shortness of breath, neg sleep apnea, COPD, neg recent URI, former smoker   Pulmonary exam normal breath sounds clear to auscultation       Cardiovascular Exercise Tolerance: Good hypertension, (-) angina (-) Past MI and (-) Cardiac Stents Normal cardiovascular exam(-) dysrhythmias (-) Valvular Problems/Murmurs Rhythm:Regular Rate:Normal  ECG 05/04/21:  Sinus rhythm with Premature supraventricular complexes Nonspecific T wave abnormality  Echo 02/17/18:  NORMAL LEFT VENTRICULAR SYSTOLIC FUNCTION  NORMAL RIGHT VENTRICULAR SYSTOLIC FUNCTION  MILD VALVULAR REGURGITATION (See above)  MILD VALVULAR STENOSIS (See above)  Mild AS/AI, 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     Neuro/Psych negative neurological ROS     GI/Hepatic negative GI ROS,,,Fatty liver   Endo/Other  diabetes, Type 2    Renal/GU negative Renal ROS     Musculoskeletal   Abdominal   Peds  Hematology negative hematology ROS (+)   Anesthesia Other Findings Past Medical History: No date: Adenomatous colon polyp No date: Aortic atherosclerosis (HCC) No date: COPD (chronic obstructive pulmonary disease) (HCC) No date: Diabetes mellitus without complication (HCC) No date: H/O: rheumatic fever No date: Herpes No date: Hypertension   Reproductive/Obstetrics                             Anesthesia Physical Anesthesia Plan  ASA: 2  Anesthesia Plan: General   Post-op Pain Management:    Induction:  Intravenous  PONV Risk Score and Plan: 2 and Treatment may vary due to age or medical condition, Propofol infusion and TIVA  Airway Management Planned: Natural Airway and Nasal Cannula  Additional Equipment:   Intra-op Plan:   Post-operative Plan:   Informed Consent: I have reviewed the patients History and Physical, chart, labs and discussed the procedure including the risks, benefits and alternatives for the proposed anesthesia with the patient or authorized representative who has indicated his/her understanding and acceptance.     Dental advisory given  Plan Discussed with: CRNA  Anesthesia Plan Comments:         Anesthesia Quick Evaluation

## 2022-09-18 NOTE — H&P (Signed)
Outpatient short stay form Pre-procedure 09/18/2022  Regis Bill, MD  Primary Physician: Lynnea Ferrier, MD  Reason for visit:  Surveillance  History of present illness:    77 y/o gentleman with history of hypertension, DM II, and COPD here for surveillance colonoscopy. Last colonoscopy in 2019 with several hyperplastic polyps. Brother with colon cancer but diagnosed when older than 60. No blood thinners. No significant abdominal surgeries.    Current Facility-Administered Medications:    0.9 %  sodium chloride infusion, , Intravenous, Continuous, Adelfa Lozito, Rossie Muskrat, MD, Last Rate: 20 mL/hr at 09/18/22 0929, New Bag at 09/18/22 0929  Medications Prior to Admission  Medication Sig Dispense Refill Last Dose   amLODipine (NORVASC) 10 MG tablet Take 10 mg by mouth daily.   09/18/2022   aspirin EC 81 MG tablet Take 81 mg by mouth daily.   09/17/2022   atorvastatin (LIPITOR) 10 MG tablet Take 10 mg by mouth daily.   09/17/2022   lisinopril (ZESTRIL) 40 MG tablet Take 40 mg by mouth daily.   09/17/2022   metFORMIN (GLUCOPHAGE) 500 MG tablet Take 500 mg by mouth daily with breakfast. For diabetes   09/17/2022   acetaminophen (TYLENOL) 500 MG tablet Take 1,000 mg by mouth every 6 (six) hours as needed for mild pain or moderate pain.      ondansetron (ZOFRAN) 4 MG tablet Take 1 tablet (4 mg total) by mouth every 8 (eight) hours as needed for nausea or vomiting. 30 tablet 0    oxyCODONE (OXY IR/ROXICODONE) 5 MG immediate release tablet Take 1 tablet (5 mg total) by mouth every 4 (four) hours as needed. 40 tablet 0      Allergies  Allergen Reactions   Azithromycin Other (See Comments)    Tongue pain Mouth sores    Tadalafil Other (See Comments)    Facial flushing     Past Medical History:  Diagnosis Date   Adenomatous colon polyp    Aortic atherosclerosis (HCC)    COPD (chronic obstructive pulmonary disease) (HCC)    Diabetes mellitus without complication (HCC)    H/O:  rheumatic fever    Herpes    Hypertension     Review of systems:  Otherwise negative.    Physical Exam  Gen: Alert, oriented. Appears stated age.  HEENT: PERRLA. Lungs: No respiratory distress CV: RRR Abd: soft, benign, no masses Ext: No edema    Planned procedures: Proceed with colonoscopy. The patient understands the nature of the planned procedure, indications, risks, alternatives and potential complications including but not limited to bleeding, infection, perforation, damage to internal organs and possible oversedation/side effects from anesthesia. The patient agrees and gives consent to proceed.  Please refer to procedure notes for findings, recommendations and patient disposition/instructions.     Regis Bill, MD [facility] Gastroenterology

## 2022-09-18 NOTE — Op Note (Signed)
[facility] Gastroenterology Patient Name: Eddie Sandoval Procedure Date: 09/18/2022 9:23 AM MRN: [id number] Account #: [id number] Date of Birth: Jun 22, 1945 Admit Type: Outpatient Age: 77 Room: [facility] ENDO ROOM 1 Gender: Male Note Status: Finalized Instrument Name: Prentice Docker [id number] Procedure:             Colonoscopy Indications:           High risk colon cancer surveillance: Personal history                         of colonic polyps, Last colonoscopy 5 years ago Providers:             Eather Colas MD, MD Referring MD:          Daniel Nones, MD (Referring MD) Medicines:             Monitored Anesthesia Care Complications:         No immediate complications. Estimated blood loss:                         Minimal. Procedure:             Pre-Anesthesia Assessment:                        - Prior to the procedure, a History and Physical was                         performed, and patient medications and allergies were                         reviewed. The patient is competent. The risks and                         benefits of the procedure and the sedation options and                         risks were discussed with the patient. All questions                         were answered and informed consent was obtained.                         Patient identification and proposed procedure were                         verified by the physician, the nurse, the                         anesthesiologist, the anesthetist and the technician                         in the endoscopy suite. Mental Status Examination:                         alert and oriented. Airway Examination: normal                         oropharyngeal airway and neck mobility. Respiratory  Examination: clear to auscultation. CV Examination:                         normal. Prophylactic Antibiotics: The patient does not                         require prophylactic antibiotics. Prior                          Anticoagulants: The patient has taken no anticoagulant                         or antiplatelet agents. ASA Grade Assessment: III - A                         patient with severe systemic disease. After reviewing                         the risks and benefits, the patient was deemed in                         satisfactory condition to undergo the procedure. The                         anesthesia plan was to use monitored anesthesia care                         (MAC). Immediately prior to administration of                         medications, the patient was re-assessed for adequacy                         to receive sedatives. The heart rate, respiratory                         rate, oxygen saturations, blood pressure, adequacy of                         pulmonary ventilation, and response to care were                         monitored throughout the procedure. The physical                         status of the patient was re-assessed after the                         procedure.                        After obtaining informed consent, the colonoscope was                         passed under direct vision. Throughout the procedure,                         the patient's blood pressure, pulse, and oxygen  saturations were monitored continuously. The                         Colonoscope was introduced through the anus and                         advanced to the the cecum, identified by appendiceal                         orifice and ileocecal valve. The colonoscopy was                         performed without difficulty. The patient tolerated                         the procedure well. The quality of the bowel                         preparation was fair. The ileocecal valve, appendiceal                         orifice, and rectum were photographed. Findings:      The perianal and digital rectal examinations were normal.      A 2 mm polyp was found in the  cecum. The polyp was sessile. The polyp       was removed with a jumbo cold forceps. Resection and retrieval were       complete. Estimated blood loss was minimal.      A 4 mm polyp was found in the ascending colon. The polyp was sessile.       The polyp was removed with a cold snare. Resection and retrieval were       complete. Estimated blood loss was minimal.      Internal hemorrhoids were found during retroflexion. The hemorrhoids       were Grade I (internal hemorrhoids that do not prolapse).      The exam was otherwise without abnormality on direct and retroflexion       views. Impression:            - Preparation of the colon was fair.                        - One 2 mm polyp in the cecum, removed with a jumbo                         cold forceps. Resected and retrieved.                        - One 4 mm polyp in the ascending colon, removed with                         a cold snare. Resected and retrieved.                        - Internal hemorrhoids.                        - The examination was otherwise normal on direct and  retroflexion views. Recommendation:        - Discharge patient to home.                        - Resume previous diet.                        - Continue present medications.                        - Await pathology results.                        - Repeat colonoscopy in 1-2 years because the bowel                         preparation was suboptimal.                        - Return to referring physician as previously                         scheduled. Procedure Code(s):     --- Professional ---                        [phone], Colonoscopy, flexible; with removal of                         tumor(s), polyp(s), or other lesion(s) by snare                         technique                        [id number], 59, Colonoscopy, flexible; with biopsy, single                         or multiple Diagnosis Code(s):     --- Professional ---                         Z86.010, Personal history of colonic polyps                        K64.0, First degree hemorrhoids                        D12.0, Benign neoplasm of cecum                        D12.2, Benign neoplasm of ascending colon CPT copyright 2022 [employer]. All rights reserved. The codes documented in this report are preliminary and upon coder review may  be revised to meet current compliance requirements. Eather Colas MD, MD 09/18/2022 10:22:20 AM Number of Addenda: 0 Note Initiated On: 09/18/2022 9:23 AM Scope Withdrawal Time: 0 hours 13 minutes 47 seconds  Total Procedure Duration: 0 hours 21 minutes 13 seconds  Estimated Blood Loss:  Estimated blood loss was minimal.      [facility]

## 2022-09-18 NOTE — Transfer of Care (Signed)
Immediate Anesthesia Transfer of Care Note  Patient: Eddie Sandoval  Procedure(s) Performed: COLONOSCOPY WITH PROPOFOL  Patient Location: PACU  Anesthesia Type:General  Level of Consciousness: sedated  Airway & Oxygen Therapy: Patient Spontanous Breathing and Patient connected to nasal cannula oxygen  Post-op Assessment: Report given to RN and Post -op Vital signs reviewed and stable  Post vital signs: Reviewed and stable  Last Vitals:  Vitals Value Taken Time  BP 112/72 09/18/22 1017  Temp    Pulse 67 09/18/22 1019  Resp 20 09/18/22 1019  SpO2 98 % 09/18/22 1019  Vitals shown include unvalidated device data.  Last Pain:  Vitals:   09/18/22 1017  TempSrc:   PainSc: Asleep         Complications: No notable events documented.

## 2022-09-19 ENCOUNTER — Encounter: Payer: Self-pay | Admitting: Gastroenterology

## 2022-09-19 LAB — SURGICAL PATHOLOGY

## 2023-02-01 ENCOUNTER — Ambulatory Visit
Admission: RE | Admit: 2023-02-01 | Discharge: 2023-02-01 | Disposition: A | Discharge status: Home / Self Care | Payer: Medicare Other | Source: Ambulatory Visit | Attending: Internal Medicine | Admitting: Internal Medicine

## 2023-02-01 DIAGNOSIS — Z122 Encounter for screening for malignant neoplasm of respiratory organs: Secondary | ICD-10-CM | POA: Diagnosis present

## 2023-02-01 DIAGNOSIS — Z87891 Personal history of nicotine dependence: Secondary | ICD-10-CM | POA: Insufficient documentation

## 2023-02-18 ENCOUNTER — Other Ambulatory Visit: Payer: Self-pay | Admitting: Acute Care

## 2023-02-18 ENCOUNTER — Other Ambulatory Visit: Payer: Self-pay | Admitting: Internal Medicine

## 2023-02-18 DIAGNOSIS — Z87891 Personal history of nicotine dependence: Secondary | ICD-10-CM

## 2023-02-18 DIAGNOSIS — Z122 Encounter for screening for malignant neoplasm of respiratory organs: Secondary | ICD-10-CM

## 2023-02-18 DIAGNOSIS — K7460 Unspecified cirrhosis of liver: Secondary | ICD-10-CM

## 2023-02-25 ENCOUNTER — Ambulatory Visit
Admission: RE | Admit: 2023-02-25 | Discharge: 2023-02-25 | Disposition: A | Discharge status: Home / Self Care | Payer: Medicare Other | Source: Ambulatory Visit | Attending: Internal Medicine | Admitting: Internal Medicine

## 2023-02-25 DIAGNOSIS — K7460 Unspecified cirrhosis of liver: Secondary | ICD-10-CM | POA: Insufficient documentation

## 2023-12-24 ENCOUNTER — Ambulatory Visit: Payer: Self-pay

## 2023-12-24 ENCOUNTER — Other Ambulatory Visit: Payer: Self-pay

## 2023-12-24 ENCOUNTER — Ambulatory Visit
Admission: RE | Admit: 2023-12-24 | Discharge: 2023-12-24 | Disposition: A | Discharge status: Home / Self Care | Attending: Gastroenterology | Admitting: Gastroenterology

## 2023-12-24 ENCOUNTER — Encounter
Admission: RE | Disposition: A | Discharge status: Home / Self Care | Payer: Self-pay | Source: Home / Self Care | Attending: Gastroenterology

## 2023-12-24 DIAGNOSIS — Z1211 Encounter for screening for malignant neoplasm of colon: Secondary | ICD-10-CM | POA: Diagnosis present

## 2023-12-24 DIAGNOSIS — E119 Type 2 diabetes mellitus without complications: Secondary | ICD-10-CM | POA: Diagnosis not present

## 2023-12-24 DIAGNOSIS — I10 Essential (primary) hypertension: Secondary | ICD-10-CM | POA: Diagnosis not present

## 2023-12-24 DIAGNOSIS — K640 First degree hemorrhoids: Secondary | ICD-10-CM | POA: Insufficient documentation

## 2023-12-24 DIAGNOSIS — Z87891 Personal history of nicotine dependence: Secondary | ICD-10-CM | POA: Insufficient documentation

## 2023-12-24 DIAGNOSIS — D123 Benign neoplasm of transverse colon: Secondary | ICD-10-CM | POA: Insufficient documentation

## 2023-12-24 DIAGNOSIS — Z7984 Long term (current) use of oral hypoglycemic drugs: Secondary | ICD-10-CM | POA: Insufficient documentation

## 2023-12-24 DIAGNOSIS — Z800 Family history of malignant neoplasm of digestive organs: Secondary | ICD-10-CM | POA: Insufficient documentation

## 2023-12-24 HISTORY — PX: POLYPECTOMY: SHX149

## 2023-12-24 HISTORY — PX: COLONOSCOPY: SHX5424

## 2023-12-24 LAB — GLUCOSE, CAPILLARY: Glucose-Capillary: 114 mg/dL — ABNORMAL HIGH (ref 70–99)

## 2023-12-24 SURGERY — COLONOSCOPY
Anesthesia: General

## 2023-12-24 MED ORDER — PROPOFOL 10 MG/ML IV BOLUS
INTRAVENOUS | Status: DC | PRN
  Administered 2023-12-24: 50 mg via INTRAVENOUS
  Administered 2023-12-24 (×3): 30 mg via INTRAVENOUS
  Administered 2023-12-24: 40 mg via INTRAVENOUS
  Administered 2023-12-24 (×3): 20 mg via INTRAVENOUS

## 2023-12-24 MED ORDER — LIDOCAINE HCL (CARDIAC) PF 100 MG/5ML IV SOSY
PREFILLED_SYRINGE | INTRAVENOUS | Status: DC | PRN
  Administered 2023-12-24: 20 mg via INTRAVENOUS

## 2023-12-24 MED ORDER — SODIUM CHLORIDE 0.9 % IV SOLN: INTRAVENOUS | Status: DC

## 2023-12-24 NOTE — Anesthesia Postprocedure Evaluation (Signed)
 Anesthesia Post Note  Patient: Eddie Sandoval  Procedure(s) Performed: COLONOSCOPY POLYPECTOMY, INTESTINE  Patient location during evaluation: Endoscopy Anesthesia Type: General Level of consciousness: awake and alert Pain management: pain level controlled Vital Signs Assessment: post-procedure vital signs reviewed and stable Respiratory status: spontaneous breathing, nonlabored ventilation, respiratory function stable and patient connected to nasal cannula oxygen Cardiovascular status: blood pressure returned to baseline and stable Postop Assessment: no apparent nausea or vomiting Anesthetic complications: no   No notable events documented.   Last Vitals:  Vitals:   12/24/23 0957 12/24/23 1007  BP: 100/64   Pulse:    Temp: (!) 35.8 C   SpO2:  100%    Last Pain:  Vitals:   12/24/23 1007  TempSrc:   PainSc: 0-No pain                 Debby Mines

## 2023-12-24 NOTE — Op Note (Signed)
 [facility] Patient Name: Eddie Sandoval Procedure Date: 12/24/2023 9:26 AM MRN: [id number] Account #: [id number] Date of Birth: 1945/10/24 Admit Type: Outpatient Age: 78 Room: [facility] ENDO ROOM 1 Gender: Male Note Status: Finalized Instrument Name: Colon Scope [id number] Procedure:             Colonoscopy Indications:           Surveillance: History of adenomatous polyps,                         inadequate prep on last exam (<68yr) Providers:             Ole Schick MD, MD Referring MD:          Ophelia Sage, MD (Referring MD) Medicines:             Monitored Anesthesia Care Complications:         No immediate complications. Estimated blood loss:                         Minimal. Procedure:             Pre-Anesthesia Assessment:                        - Prior to the procedure, a History and Physical was                         performed, and patient medications and allergies were                         reviewed. The patient is competent. The risks and                         benefits of the procedure and the sedation options and                         risks were discussed with the patient. All questions                         were answered and informed consent was obtained.                         Patient identification and proposed procedure were                         verified by the physician, the nurse, the                         anesthesiologist, the anesthetist and the technician                         in the endoscopy suite. Mental Status Examination:                         alert and oriented. Airway Examination: normal                         oropharyngeal airway and neck mobility. Respiratory  Examination: clear to auscultation. CV Examination:                         normal. Prophylactic Antibiotics: The patient does not                         require prophylactic antibiotics. Prior                          Anticoagulants: The patient has taken no anticoagulant                         or antiplatelet agents. ASA Grade Assessment: III - A                         patient with severe systemic disease. After reviewing                         the risks and benefits, the patient was deemed in                         satisfactory condition to undergo the procedure. The                         anesthesia plan was to use monitored anesthesia care                         (MAC). Immediately prior to administration of                         medications, the patient was re-assessed for adequacy                         to receive sedatives. The heart rate, respiratory                         rate, oxygen saturations, blood pressure, adequacy of                         pulmonary ventilation, and response to care were                         monitored throughout the procedure. The physical                         status of the patient was re-assessed after the                         procedure.                        After obtaining informed consent, the colonoscope was                         passed under direct vision. Throughout the procedure,                         the patient's blood pressure, pulse, and oxygen  saturations were monitored continuously. The                         Colonoscope was introduced through the anus and                         advanced to the the cecum, identified by appendiceal                         orifice and ileocecal valve. The colonoscopy was                         performed without difficulty. The patient tolerated                         the procedure well. The quality of the bowel                         preparation was good. The ileocecal valve, appendiceal                         orifice, and rectum were photographed. Findings:      The perianal and digital rectal examinations were normal.      Two sessile polyps were found in the proximal  transverse colon. The       polyps were 2 to 3 mm in size. These polyps were removed with a cold       snare. Resection and retrieval were complete. Estimated blood loss was       minimal.      Internal hemorrhoids were found during retroflexion. The hemorrhoids       were Grade I (internal hemorrhoids that do not prolapse).      The exam was otherwise without abnormality on direct and retroflexion       views. Impression:            - Two 2 to 3 mm polyps in the proximal transverse                         colon, removed with a cold snare. Resected and                         retrieved.                        - Internal hemorrhoids.                        - The examination was otherwise normal on direct and                         retroflexion views. Recommendation:        - Discharge patient to home.                        - Resume previous diet.                        - Continue present medications.                        -  Await pathology results.                        - Repeat colonoscopy is not recommended due to current                         age (46 years or older) for surveillance.                        - Return to referring physician as previously                         scheduled. Procedure Code(s):     --- Professional ---                        [phone], Colonoscopy, flexible; with removal of                         tumor(s), polyp(s), or other lesion(s) by snare                         technique Diagnosis Code(s):     --- Professional ---                        Z86.010, Personal history of colonic polyps                        D12.3, Benign neoplasm of transverse colon (hepatic                         flexure or splenic flexure)                        K64.0, First degree hemorrhoids CPT copyright 2022 [employer]. All rights reserved. The codes documented in this report are preliminary and upon coder review may  be revised to meet current compliance  requirements. Ole Schick MD, MD 12/24/2023 10:03:17 AM Number of Addenda: 0 Note Initiated On: 12/24/2023 9:26 AM Scope Withdrawal Time: 0 hours 9 minutes 17 seconds  Total Procedure Duration: 0 hours 17 minutes 11 seconds  Estimated Blood Loss:  Estimated blood loss was minimal.      [facility]

## 2023-12-24 NOTE — H&P (Signed)
 Outpatient short stay form Pre-procedure 12/24/2023  Eddie ONEIDA Schick, MD  Primary Physician: Fernande Ophelia JINNY DOUGLAS, MD  Reason for visit:  Surveillance  History of present illness:    78 y/o gentleman with history of hypertension, DM II, and COPD here for colonoscopy for history of adenomatous polyps. Last colonoscopy was last year with inadequate prep. No blood thinners. Brother had colon cancer in his 78's. No significant abdominal surgeries.    Current Facility-Administered Medications:    0.9 %  sodium chloride  infusion, , Intravenous, Continuous, Zaylan Kissoon, Eddie ONEIDA, MD, Last Rate: 20 mL/hr at 12/24/23 9076, Continued from Pre-op at 12/24/23 0923  Medications Prior to Admission  Medication Sig Dispense Refill Last Dose/Taking   acetaminophen  (TYLENOL ) 500 MG tablet Take 1,000 mg by mouth every 6 (six) hours as needed for mild pain or moderate pain.   12/23/2023   amLODipine (NORVASC) 10 MG tablet Take 10 mg by mouth daily.   12/24/2023 at  4:00 AM   aspirin EC 81 MG tablet Take 81 mg by mouth daily.   12/23/2023   atorvastatin (LIPITOR) 10 MG tablet Take 10 mg by mouth daily.   12/23/2023   lisinopril (ZESTRIL) 40 MG tablet Take 40 mg by mouth daily.   12/23/2023   metFORMIN (GLUCOPHAGE) 500 MG tablet Take 500 mg by mouth daily with breakfast. For diabetes   Past Week   oxyCODONE  (OXY IR/ROXICODONE ) 5 MG immediate release tablet Take 1 tablet (5 mg total) by mouth every 4 (four) hours as needed. 40 tablet 0 Past Week   ondansetron  (ZOFRAN ) 4 MG tablet Take 1 tablet (4 mg total) by mouth every 8 (eight) hours as needed for nausea or vomiting. 30 tablet 0      Allergies  Allergen Reactions   Azithromycin Other (See Comments)    Tongue pain Mouth sores    Tadalafil Other (See Comments)    Facial flushing     Past Medical History:  Diagnosis Date   Adenomatous colon polyp    Aortic atherosclerosis (HCC)    COPD (chronic obstructive pulmonary disease) (HCC)    Diabetes mellitus  without complication (HCC)    H/O: rheumatic fever    Herpes    Hypertension     Review of systems:  Otherwise negative.    Physical Exam  Gen: Alert, oriented. Appears stated age.  HEENT: PERRLA. Lungs: No respiratory distress CV: RRR Abd: soft, benign, no masses Ext: No edema    Planned procedures: Proceed with colonoscopy. The patient understands the nature of the planned procedure, indications, risks, alternatives and potential complications including but not limited to bleeding, infection, perforation, damage to internal organs and possible oversedation/side effects from anesthesia. The patient agrees and gives consent to proceed.  Please refer to procedure notes for findings, recommendations and patient disposition/instructions.     Eddie ONEIDA Schick, MD [facility]astroenter[facility]

## 2023-12-24 NOTE — Transfer of Care (Signed)
 Immediate Anesthesia Transfer of Care Note  Patient: Eddie Sandoval  Procedure(s) Performed: COLONOSCOPY POLYPECTOMY, INTESTINE  Patient Location: PACU  Anesthesia Type:MAC  Level of Consciousness: drowsy  Airway & Oxygen Therapy: Patient Spontanous Breathing  Post-op Assessment: Report given to RN and Post -op Vital signs reviewed and stable  Post vital signs: Reviewed and stable  Last Vitals:  Vitals Value Taken Time  BP 100/64 12/24/23 09:59  Temp 35.8 C 12/24/23 09:57  Pulse 61 12/24/23 09:59  Resp 13 12/24/23 09:59  SpO2 99 % 12/24/23 09:59  Vitals shown include unfiled device data.  Last Pain:  Vitals:   12/24/23 0957  TempSrc: Temporal  PainSc: Asleep         Complications: No notable events documented.

## 2023-12-24 NOTE — Interval H&P Note (Signed)
 History and Physical Interval Note:  12/24/2023 9:31 AM  Eddie Sandoval  has presented today for surgery, with the diagnosis of HX OF ADENOMATOUS POLYP OF COLON.  The various methods of treatment have been discussed with the patient and family. After consideration of risks, benefits and other options for treatment, the patient has consented to  Procedure(s): COLONOSCOPY (N/A) as a surgical intervention.  The patient's history has been reviewed, patient examined, no change in status, stable for surgery.  I have reviewed the patient's chart and labs.  Questions were answered to the patient's satisfaction.     Eddie Sandoval  Ok to proceed with colonoscopy

## 2023-12-24 NOTE — Anesthesia Preprocedure Evaluation (Signed)
 Anesthesia Evaluation  Patient identified by MRN, date of birth, ID band Patient awake    Reviewed: Allergy & Precautions, NPO status , Patient's Chart, lab work & pertinent test results  History of Anesthesia Complications Negative for: history of anesthetic complications  Airway Mallampati: III   Neck ROM: Full    Dental  (+) Upper Dentures, Lower Dentures, Dental Advidsory Given Three lower teeth intact:   Pulmonary neg shortness of breath, neg sleep apnea, COPD, neg recent URI, former smoker   Pulmonary exam normal breath sounds clear to auscultation       Cardiovascular Exercise Tolerance: Good hypertension, (-) angina (-) Past MI and (-) Cardiac Stents Normal cardiovascular exam(-) dysrhythmias (-) Valvular Problems/Murmurs Rhythm:Regular Rate:Normal  ECG 05/04/21:  Sinus rhythm with Premature supraventricular complexes Nonspecific T wave abnormality  Echo 02/17/18:  NORMAL LEFT VENTRICULAR SYSTOLIC FUNCTION  NORMAL RIGHT VENTRICULAR SYSTOLIC FUNCTION  MILD VALVULAR REGURGITATION (See above)  MILD VALVULAR STENOSIS (See above)  Mild AS/AI, 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     Neuro/Psych negative neurological ROS     GI/Hepatic negative GI ROS,,,Fatty liver   Endo/Other  diabetes, Type 2    Renal/GU      Musculoskeletal   Abdominal   Peds  Hematology negative hematology ROS (+)   Anesthesia Other Findings Past Medical History: No date: Adenomatous colon polyp No date: Aortic atherosclerosis (HCC) No date: COPD (chronic obstructive pulmonary disease) (HCC) No date: Diabetes mellitus without complication (HCC) No date: H/O: rheumatic fever No date: Herpes No date: Hypertension   Reproductive/Obstetrics                              Anesthesia Physical Anesthesia Plan  ASA: 3  Anesthesia Plan: General   Post-op Pain Management: Minimal or no pain anticipated   Induction:  Intravenous  PONV Risk Score and Plan: 2 and Propofol  infusion and TIVA  Airway Management Planned: Nasal Cannula  Additional Equipment: None  Intra-op Plan:   Post-operative Plan:   Informed Consent: I have reviewed the patients History and Physical, chart, labs and discussed the procedure including the risks, benefits and alternatives for the proposed anesthesia with the patient or authorized representative who has indicated his/her understanding and acceptance.     Dental advisory given  Plan Discussed with: CRNA and Surgeon  Anesthesia Plan Comments: (Discussed risks of anesthesia with patient, including possibility of difficulty with spontaneous ventilation under anesthesia necessitating airway intervention, PONV, and rare risks such as cardiac or respiratory or neurological events, and allergic reactions. Discussed the role of CRNA in patient's perioperative care. Patient understands.)        Anesthesia Quick Evaluation

## 2023-12-25 LAB — SURGICAL PATHOLOGY

## 2024-02-03 ENCOUNTER — Ambulatory Visit
Admission: RE | Admit: 2024-02-03 | Discharge: 2024-02-03 | Disposition: A | Discharge status: Home / Self Care | Source: Ambulatory Visit | Attending: Internal Medicine | Admitting: Internal Medicine

## 2024-02-03 DIAGNOSIS — Z87891 Personal history of nicotine dependence: Secondary | ICD-10-CM | POA: Insufficient documentation

## 2024-02-03 DIAGNOSIS — Z122 Encounter for screening for malignant neoplasm of respiratory organs: Secondary | ICD-10-CM | POA: Diagnosis present

## 2024-02-07 ENCOUNTER — Telehealth: Payer: Self-pay | Admitting: *Deleted

## 2024-02-07 DIAGNOSIS — R911 Solitary pulmonary nodule: Secondary | ICD-10-CM

## 2024-02-07 NOTE — Telephone Encounter (Signed)
 Call report from [facility]:  IMPRESSION: 1. 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 Adenocarcinoma cannot be excluded. Lung-RADS 3, probably benign findings. Short-term follow-up in 6 months is recommended with repeat low-dose chest CT without contrast (please use the following order, CT CHEST LCS NODULE FOLLOW-UP W/O CM). These results will be called to the ordering clinician or representative by the Radiologist Assistant, and communication documented in the PACS or [employer]. 2. New 4.6 mm lateral segment right middle lobe nodule can be reassessed on follow-up imaging recommended above. 3. Aortic atherosclerosis (ICD10-I70.0). Coronary artery calcification. 4. Enlarged pulmonic trunk, indicative of pulmonary arterial hypertension. 5.  Emphysema (ICD10-J43.9).

## 2024-02-10 NOTE — Telephone Encounter (Signed)
 Results have been reviewed by Ruthell, NP. Please call patient and review recent Lung CT results. He will need a scan in 6 months to evaluate new 4.6 mm nodule. Place order. Send results and plan to PCP.

## 2024-02-11 NOTE — Telephone Encounter (Signed)
 Called and left VM for pt

## 2024-02-13 NOTE — Telephone Encounter (Signed)
LVM to review results.

## 2024-02-13 NOTE — Telephone Encounter (Signed)
 Letter mailed to home

## 2024-02-14 NOTE — Telephone Encounter (Signed)
 Spoke with patient to review results of LDCT.  New lung nodule with recommendation to repeat CT in 6 months, as precaution.  Patient had not had any recent illness or chest congestion. Also noted was emphysema and atherosclerosis, as previously seen.  New finding suggests pulmonary artery hypertension.

## 2024-02-14 NOTE — Telephone Encounter (Signed)
 Patient is agreeable to repeat scan in 6 months.  Will call closer to date to schedule. New order placed for LDCT and results/plan faxed to PCP.  Patient has upcoming appointment with PCP will discuss findings further at that appointment.

## 2024-02-14 NOTE — Addendum Note (Signed)
 Addended by: DIONISIO AQUAS on: 02/14/2024 10:14 AM   Modules accepted: Orders

## 2024-02-20 ENCOUNTER — Other Ambulatory Visit: Payer: Self-pay | Admitting: Internal Medicine

## 2024-02-20 DIAGNOSIS — K7460 Unspecified cirrhosis of liver: Secondary | ICD-10-CM

## 2024-02-27 ENCOUNTER — Ambulatory Visit
Admission: RE | Admit: 2024-02-27 | Discharge: 2024-02-27 | Disposition: A | Discharge status: Home / Self Care | Source: Ambulatory Visit | Attending: Internal Medicine | Admitting: Internal Medicine

## 2024-02-27 DIAGNOSIS — K7460 Unspecified cirrhosis of liver: Secondary | ICD-10-CM | POA: Diagnosis present

## 2024-06-09 ENCOUNTER — Encounter: Payer: Self-pay | Admitting: Acute Care
